# Patient Record
Sex: Female | Born: 1935 | Race: White | Hispanic: No | Smoking: Former smoker
Health system: Southern US, Community
[De-identification: ages and names within clinical notes are randomized; demographics above are authoritative.]

## PROBLEM LIST (undated history)

## (undated) DIAGNOSIS — E119 Type 2 diabetes mellitus without complications: Secondary | ICD-10-CM

## (undated) DIAGNOSIS — D649 Anemia, unspecified: Secondary | ICD-10-CM

## (undated) DIAGNOSIS — R011 Cardiac murmur, unspecified: Secondary | ICD-10-CM

## (undated) DIAGNOSIS — H548 Legal blindness, as defined in USA: Secondary | ICD-10-CM

## (undated) DIAGNOSIS — K08109 Complete loss of teeth, unspecified cause, unspecified class: Secondary | ICD-10-CM

## (undated) DIAGNOSIS — I8393 Asymptomatic varicose veins of bilateral lower extremities: Secondary | ICD-10-CM

## (undated) DIAGNOSIS — K219 Gastro-esophageal reflux disease without esophagitis: Secondary | ICD-10-CM

## (undated) DIAGNOSIS — M1990 Unspecified osteoarthritis, unspecified site: Secondary | ICD-10-CM

## (undated) DIAGNOSIS — E039 Hypothyroidism, unspecified: Secondary | ICD-10-CM

## (undated) DIAGNOSIS — I10 Essential (primary) hypertension: Secondary | ICD-10-CM

## (undated) DIAGNOSIS — I729 Aneurysm of unspecified site: Secondary | ICD-10-CM

## (undated) DIAGNOSIS — Z8719 Personal history of other diseases of the digestive system: Secondary | ICD-10-CM

## (undated) DIAGNOSIS — K222 Esophageal obstruction: Secondary | ICD-10-CM

## (undated) DIAGNOSIS — M109 Gout, unspecified: Secondary | ICD-10-CM

## (undated) HISTORY — PX: OTHER SURGICAL HISTORY: SHX169

## (undated) HISTORY — PX: CHOLECYSTECTOMY: SHX55

## (undated) HISTORY — PX: RECTAL SURGERY: SHX760

## (undated) HISTORY — PX: CERVICAL DISC SURGERY: SHX588

## (undated) HISTORY — PX: APPENDECTOMY: SHX54

## (undated) HISTORY — PX: ABDOMINAL HYSTERECTOMY: SHX81

## (undated) HISTORY — PX: COLONOSCOPY: SHX174

---

## 1965-08-30 DIAGNOSIS — I729 Aneurysm of unspecified site: Secondary | ICD-10-CM

## 1965-08-30 HISTORY — PX: CRANIOTOMY: SHX93

## 1965-08-30 HISTORY — DX: Aneurysm of unspecified site: I72.9

## 2004-04-22 ENCOUNTER — Encounter: Admission: RE | Admit: 2004-04-22 | Discharge: 2004-04-22 | Payer: Self-pay | Admitting: Neurosurgery

## 2004-05-26 ENCOUNTER — Inpatient Hospital Stay (HOSPITAL_COMMUNITY): Admission: RE | Admit: 2004-05-26 | Discharge: 2004-05-27 | Payer: Self-pay | Admitting: Neurosurgery

## 2004-06-17 ENCOUNTER — Encounter: Admission: RE | Admit: 2004-06-17 | Discharge: 2004-06-17 | Payer: Self-pay | Admitting: Neurosurgery

## 2004-09-24 ENCOUNTER — Ambulatory Visit: Payer: Self-pay | Admitting: Obstetrics and Gynecology

## 2006-04-25 ENCOUNTER — Ambulatory Visit: Payer: Self-pay

## 2006-04-29 ENCOUNTER — Ambulatory Visit: Payer: Self-pay

## 2007-07-21 ENCOUNTER — Ambulatory Visit: Payer: Self-pay | Admitting: Gastroenterology

## 2008-08-28 ENCOUNTER — Ambulatory Visit: Payer: Self-pay | Admitting: Family Medicine

## 2009-09-25 ENCOUNTER — Ambulatory Visit: Payer: Self-pay | Admitting: Nurse Practitioner

## 2011-06-29 DIAGNOSIS — R911 Solitary pulmonary nodule: Secondary | ICD-10-CM

## 2011-06-29 DIAGNOSIS — E1169 Type 2 diabetes mellitus with other specified complication: Secondary | ICD-10-CM | POA: Insufficient documentation

## 2011-06-29 DIAGNOSIS — F419 Anxiety disorder, unspecified: Secondary | ICD-10-CM | POA: Insufficient documentation

## 2011-06-29 DIAGNOSIS — Z8673 Personal history of transient ischemic attack (TIA), and cerebral infarction without residual deficits: Secondary | ICD-10-CM

## 2011-06-29 DIAGNOSIS — M810 Age-related osteoporosis without current pathological fracture: Secondary | ICD-10-CM

## 2011-06-29 DIAGNOSIS — K219 Gastro-esophageal reflux disease without esophagitis: Secondary | ICD-10-CM | POA: Insufficient documentation

## 2011-06-29 DIAGNOSIS — J302 Other seasonal allergic rhinitis: Secondary | ICD-10-CM | POA: Insufficient documentation

## 2011-06-29 DIAGNOSIS — M109 Gout, unspecified: Secondary | ICD-10-CM | POA: Insufficient documentation

## 2011-06-29 DIAGNOSIS — I340 Nonrheumatic mitral (valve) insufficiency: Secondary | ICD-10-CM

## 2011-06-29 HISTORY — DX: Type 2 diabetes mellitus with other specified complication: E11.69

## 2011-06-29 HISTORY — DX: Personal history of transient ischemic attack (TIA), and cerebral infarction without residual deficits: Z86.73

## 2011-06-29 HISTORY — DX: Solitary pulmonary nodule: R91.1

## 2011-06-29 HISTORY — DX: Nonrheumatic mitral (valve) insufficiency: I34.0

## 2011-06-29 HISTORY — DX: Age-related osteoporosis without current pathological fracture: M81.0

## 2011-08-04 ENCOUNTER — Ambulatory Visit: Payer: Self-pay | Admitting: Family Medicine

## 2011-09-02 ENCOUNTER — Ambulatory Visit: Payer: Self-pay | Admitting: Surgery

## 2011-09-02 DIAGNOSIS — E119 Type 2 diabetes mellitus without complications: Secondary | ICD-10-CM

## 2011-09-02 LAB — CBC WITH DIFFERENTIAL/PLATELET
Basophil #: 0 10*3/uL (ref 0.0–0.1)
Basophil %: 0.6 %
Eosinophil #: 0.2 10*3/uL (ref 0.0–0.7)
Eosinophil %: 2.6 %
HCT: 41.3 % (ref 35.0–47.0)
HGB: 14.1 g/dL (ref 12.0–16.0)
Lymphocyte #: 2 10*3/uL (ref 1.0–3.6)
Lymphocyte %: 25.6 %
MCH: 29.8 pg (ref 26.0–34.0)
MCHC: 34.2 g/dL (ref 32.0–36.0)
MCV: 87 fL (ref 80–100)
Monocyte #: 0.5 10*3/uL (ref 0.0–0.7)
Monocyte %: 6.9 %
Neutrophil #: 5.1 10*3/uL (ref 1.4–6.5)
Neutrophil %: 64.3 %
Platelet: 280 10*3/uL (ref 150–440)
RBC: 4.74 10*6/uL (ref 3.80–5.20)
RDW: 13.2 % (ref 11.5–14.5)
WBC: 7.9 10*3/uL (ref 3.6–11.0)

## 2011-09-02 LAB — BASIC METABOLIC PANEL
Anion Gap: 10 (ref 7–16)
BUN: 18 mg/dL (ref 7–18)
Calcium, Total: 9 mg/dL (ref 8.5–10.1)
Chloride: 102 mmol/L (ref 98–107)
Co2: 30 mmol/L (ref 21–32)
Creatinine: 0.95 mg/dL (ref 0.60–1.30)
EGFR (African American): >60
EGFR (Non-African Amer.): >60
Glucose: 250 mg/dL — ABNORMAL HIGH (ref 65–99)
Osmolality: 293 (ref 275–301)
Potassium: 3.2 mmol/L — ABNORMAL LOW (ref 3.5–5.1)
Sodium: 142 mmol/L (ref 136–145)

## 2011-09-07 ENCOUNTER — Ambulatory Visit: Payer: Self-pay | Admitting: Surgery

## 2011-09-07 LAB — BASIC METABOLIC PANEL
Anion Gap: 7 (ref 7–16)
BUN: 17 mg/dL (ref 7–18)
Calcium, Total: 8.9 mg/dL (ref 8.5–10.1)
Chloride: 100 mmol/L (ref 98–107)
Co2: 33 mmol/L — ABNORMAL HIGH (ref 21–32)
Creatinine: 0.93 mg/dL (ref 0.60–1.30)
EGFR (African American): >60
EGFR (Non-African Amer.): >60
Glucose: 151 mg/dL — ABNORMAL HIGH (ref 65–99)
Osmolality: 284 (ref 275–301)
Potassium: 3.5 mmol/L (ref 3.5–5.1)
Sodium: 140 mmol/L (ref 136–145)

## 2011-09-08 ENCOUNTER — Ambulatory Visit: Payer: Self-pay | Admitting: Surgery

## 2011-09-08 LAB — POTASSIUM: Potassium: 3.6 mmol/L (ref 3.5–5.1)

## 2011-09-09 LAB — COMPREHENSIVE METABOLIC PANEL
Albumin: 3.2 g/dL — ABNORMAL LOW (ref 3.4–5.0)
Alkaline Phosphatase: 71 U/L (ref 50–136)
Anion Gap: 10 (ref 7–16)
BUN: 7 mg/dL (ref 7–18)
Bilirubin,Total: 0.6 mg/dL (ref 0.2–1.0)
Calcium, Total: 8.4 mg/dL — ABNORMAL LOW (ref 8.5–10.1)
Chloride: 102 mmol/L (ref 98–107)
Co2: 27 mmol/L (ref 21–32)
Creatinine: 0.87 mg/dL (ref 0.60–1.30)
EGFR (African American): >60
EGFR (Non-African Amer.): >60
Glucose: 230 mg/dL — ABNORMAL HIGH (ref 65–99)
Osmolality: 283 (ref 275–301)
Potassium: 3.7 mmol/L (ref 3.5–5.1)
SGOT(AST): 23 U/L (ref 15–37)
SGPT (ALT): 24 U/L
Sodium: 139 mmol/L (ref 136–145)
Total Protein: 7.3 g/dL (ref 6.4–8.2)

## 2011-09-09 LAB — CBC WITH DIFFERENTIAL/PLATELET
Basophil #: 0 10*3/uL (ref 0.0–0.1)
Basophil %: 0.3 %
Eosinophil #: 0 10*3/uL (ref 0.0–0.7)
Eosinophil %: 0.2 %
HCT: 39.9 % (ref 35.0–47.0)
HGB: 13.4 g/dL (ref 12.0–16.0)
Lymphocyte #: 1.3 10*3/uL (ref 1.0–3.6)
Lymphocyte %: 11.9 %
MCH: 29.5 pg (ref 26.0–34.0)
MCHC: 33.7 g/dL (ref 32.0–36.0)
MCV: 87 fL (ref 80–100)
Monocyte #: 0.7 10*3/uL (ref 0.0–0.7)
Monocyte %: 6.5 %
Neutrophil #: 9 10*3/uL — ABNORMAL HIGH (ref 1.4–6.5)
Neutrophil %: 81.1 %
Platelet: 288 10*3/uL (ref 150–440)
RBC: 4.57 10*6/uL (ref 3.80–5.20)
RDW: 13.1 % (ref 11.5–14.5)
WBC: 11.1 10*3/uL — ABNORMAL HIGH (ref 3.6–11.0)

## 2011-09-09 LAB — PATHOLOGY REPORT

## 2011-12-24 ENCOUNTER — Emergency Department: Payer: Self-pay | Admitting: Emergency Medicine

## 2012-02-15 ENCOUNTER — Ambulatory Visit: Payer: Self-pay | Admitting: Family Medicine

## 2012-06-09 DIAGNOSIS — I10 Essential (primary) hypertension: Secondary | ICD-10-CM | POA: Insufficient documentation

## 2013-11-12 ENCOUNTER — Ambulatory Visit: Payer: Self-pay | Admitting: Unknown Physician Specialty

## 2013-11-25 DIAGNOSIS — D696 Thrombocytopenia, unspecified: Secondary | ICD-10-CM

## 2013-11-25 HISTORY — DX: Thrombocytopenia, unspecified: D69.6

## 2013-11-26 ENCOUNTER — Ambulatory Visit: Payer: Self-pay | Admitting: Family Medicine

## 2013-11-26 LAB — CBC
HCT: 35.8 % (ref 35.0–47.0)
HGB: 11.6 g/dL — ABNORMAL LOW (ref 12.0–16.0)
MCH: 27.5 pg (ref 26.0–34.0)
MCHC: 32.5 g/dL (ref 32.0–36.0)
MCV: 85 fL (ref 80–100)
Platelet: 588 10*3/uL — ABNORMAL HIGH (ref 150–440)
RBC: 4.23 10*6/uL (ref 3.80–5.20)
RDW: 14.6 % — ABNORMAL HIGH (ref 11.5–14.5)
WBC: 16 10*3/uL — ABNORMAL HIGH (ref 3.6–11.0)

## 2013-11-26 LAB — URINALYSIS, COMPLETE
Bacteria: NONE SEEN
Bilirubin,UR: NEGATIVE
Glucose,UR: NEGATIVE mg/dL (ref 0–75)
Ketone: NEGATIVE
Leukocyte Esterase: NEGATIVE
Nitrite: NEGATIVE
Ph: 6 (ref 4.5–8.0)
Protein: 30
RBC,UR: <1 /HPF (ref 0–5)
Specific Gravity: 1.025 (ref 1.003–1.030)
Squamous Epithelial: <1
WBC UR: 2 /HPF (ref 0–5)

## 2013-11-26 LAB — COMPREHENSIVE METABOLIC PANEL
Albumin: 3 g/dL — ABNORMAL LOW (ref 3.4–5.0)
Alkaline Phosphatase: 99 U/L
Anion Gap: 7 (ref 7–16)
BUN: 23 mg/dL — ABNORMAL HIGH (ref 7–18)
Bilirubin,Total: 0.4 mg/dL (ref 0.2–1.0)
Calcium, Total: 9.2 mg/dL (ref 8.5–10.1)
Chloride: 97 mmol/L — ABNORMAL LOW (ref 98–107)
Co2: 28 mmol/L (ref 21–32)
Creatinine: 1.07 mg/dL (ref 0.60–1.30)
EGFR (African American): 56 — ABNORMAL LOW
EGFR (Non-African Amer.): 49 — ABNORMAL LOW
Glucose: 214 mg/dL — ABNORMAL HIGH (ref 65–99)
Osmolality: 275 (ref 275–301)
Potassium: 3.7 mmol/L (ref 3.5–5.1)
SGOT(AST): 13 U/L — ABNORMAL LOW (ref 15–37)
SGPT (ALT): 18 U/L (ref 12–78)
Sodium: 132 mmol/L — ABNORMAL LOW (ref 136–145)
Total Protein: 7.9 g/dL (ref 6.4–8.2)

## 2013-11-26 LAB — MAGNESIUM: Magnesium: 1.6 mg/dL — ABNORMAL LOW

## 2013-11-26 LAB — LIPASE, BLOOD: Lipase: 119 U/L (ref 73–393)

## 2013-11-26 LAB — TROPONIN I: Troponin-I: <0.02 ng/mL

## 2013-11-27 ENCOUNTER — Observation Stay: Payer: Self-pay | Admitting: Internal Medicine

## 2013-11-27 LAB — CBC WITH DIFFERENTIAL/PLATELET
Basophil #: 0.1 10*3/uL (ref 0.0–0.1)
Basophil %: 0.6 %
Eosinophil #: 0.2 10*3/uL (ref 0.0–0.7)
Eosinophil %: 1.3 %
HCT: 36.1 % (ref 35.0–47.0)
HGB: 11.7 g/dL — ABNORMAL LOW (ref 12.0–16.0)
Lymphocyte #: 1.4 10*3/uL (ref 1.0–3.6)
Lymphocyte %: 10.4 %
MCH: 27.4 pg (ref 26.0–34.0)
MCHC: 32.6 g/dL (ref 32.0–36.0)
MCV: 84 fL (ref 80–100)
Monocyte #: 1 x10 3/mm — ABNORMAL HIGH (ref 0.2–0.9)
Monocyte %: 7.4 %
Neutrophil #: 10.7 10*3/uL — ABNORMAL HIGH (ref 1.4–6.5)
Neutrophil %: 80.3 %
Platelet: 574 10*3/uL — ABNORMAL HIGH (ref 150–440)
RBC: 4.28 10*6/uL (ref 3.80–5.20)
RDW: 14.5 % (ref 11.5–14.5)
WBC: 13.4 10*3/uL — ABNORMAL HIGH (ref 3.6–11.0)

## 2013-11-27 LAB — TROPONIN I
Troponin-I: <0.02 ng/mL
Troponin-I: <0.02 ng/mL

## 2013-11-27 LAB — BASIC METABOLIC PANEL
Anion Gap: 4 — ABNORMAL LOW (ref 7–16)
BUN: 13 mg/dL (ref 7–18)
Calcium, Total: 8.8 mg/dL (ref 8.5–10.1)
Chloride: 102 mmol/L (ref 98–107)
Co2: 29 mmol/L (ref 21–32)
Creatinine: 0.81 mg/dL (ref 0.60–1.30)
EGFR (African American): >60
EGFR (Non-African Amer.): >60
Glucose: 168 mg/dL — ABNORMAL HIGH (ref 65–99)
Osmolality: 274 (ref 275–301)
Potassium: 3.4 mmol/L — ABNORMAL LOW (ref 3.5–5.1)
Sodium: 135 mmol/L — ABNORMAL LOW (ref 136–145)

## 2013-11-27 LAB — CK TOTAL AND CKMB (NOT AT ARMC)
CK, Total: 29 U/L
CK, Total: 32 U/L
CK-MB: 1.3 ng/mL (ref 0.5–3.6)
CK-MB: 1 ng/mL (ref 0.5–3.6)

## 2013-12-02 LAB — CULTURE, BLOOD (SINGLE)

## 2013-12-17 DIAGNOSIS — R634 Abnormal weight loss: Secondary | ICD-10-CM | POA: Insufficient documentation

## 2013-12-17 DIAGNOSIS — R531 Weakness: Secondary | ICD-10-CM | POA: Insufficient documentation

## 2013-12-17 DIAGNOSIS — R2689 Other abnormalities of gait and mobility: Secondary | ICD-10-CM | POA: Insufficient documentation

## 2013-12-21 ENCOUNTER — Ambulatory Visit: Payer: Self-pay | Admitting: Family Medicine

## 2013-12-27 ENCOUNTER — Ambulatory Visit: Payer: Self-pay | Admitting: Internal Medicine

## 2013-12-27 LAB — CBC CANCER CENTER
HCT: 33.2 % — ABNORMAL LOW (ref 35.0–47.0)
HGB: 10.5 g/dL — ABNORMAL LOW (ref 12.0–16.0)
Lymphocytes: 13 %
MCH: 26.1 pg (ref 26.0–34.0)
MCHC: 31.6 g/dL — ABNORMAL LOW (ref 32.0–36.0)
MCV: 83 fL (ref 80–100)
Monocytes: 5 %
Platelet: 649 x10 3/mm — ABNORMAL HIGH (ref 150–440)
RBC: 4.03 10*6/uL (ref 3.80–5.20)
RDW: 15.7 % — ABNORMAL HIGH (ref 11.5–14.5)
Segmented Neutrophils: 82 %
WBC: 11.2 x10 3/mm — ABNORMAL HIGH (ref 3.6–11.0)

## 2013-12-27 LAB — RETICULOCYTES
Absolute Retic Count: 0.081 10*6/uL (ref 0.019–0.186)
Reticulocyte: 2.1 % (ref 0.4–3.1)

## 2013-12-27 LAB — IRON AND TIBC
Iron Bind.Cap.(Total): 303 ug/dL (ref 250–450)
Iron Saturation: 7 %
Iron: 21 ug/dL — ABNORMAL LOW (ref 50–170)
Unbound Iron-Bind.Cap.: 282 ug/dL

## 2013-12-27 LAB — FOLATE: Folic Acid: 15.9 ng/mL (ref 3.1–100.0)

## 2013-12-27 LAB — LACTATE DEHYDROGENASE: LDH: 129 U/L (ref 81–246)

## 2013-12-27 LAB — FERRITIN: Ferritin (ARMC): 140 ng/mL (ref 8–388)

## 2013-12-27 LAB — SEDIMENTATION RATE: Erythrocyte Sed Rate: 79 mm/hr — ABNORMAL HIGH (ref 0–30)

## 2013-12-28 ENCOUNTER — Ambulatory Visit: Payer: Self-pay | Admitting: Internal Medicine

## 2013-12-28 LAB — PROT IMMUNOELECTROPHORES(ARMC)

## 2014-01-04 ENCOUNTER — Ambulatory Visit: Payer: Self-pay | Admitting: Gastroenterology

## 2014-01-05 ENCOUNTER — Ambulatory Visit: Payer: Self-pay | Admitting: Internal Medicine

## 2014-01-23 ENCOUNTER — Ambulatory Visit: Payer: Self-pay | Admitting: Family Medicine

## 2014-01-28 ENCOUNTER — Ambulatory Visit: Payer: Self-pay | Admitting: Internal Medicine

## 2014-02-14 DIAGNOSIS — R809 Proteinuria, unspecified: Secondary | ICD-10-CM

## 2014-02-14 DIAGNOSIS — E559 Vitamin D deficiency, unspecified: Secondary | ICD-10-CM | POA: Insufficient documentation

## 2014-02-14 HISTORY — DX: Proteinuria, unspecified: R80.9

## 2014-04-03 ENCOUNTER — Ambulatory Visit: Payer: Self-pay | Admitting: Internal Medicine

## 2014-04-03 LAB — CBC CANCER CENTER
Basophil #: 0.1 x10 3/mm (ref 0.0–0.1)
Basophil %: 0.8 %
Eosinophil #: 0.2 x10 3/mm (ref 0.0–0.7)
Eosinophil %: 1.7 %
HCT: 37.7 % (ref 35.0–47.0)
HGB: 11.7 g/dL — ABNORMAL LOW (ref 12.0–16.0)
Lymphocyte #: 2.9 x10 3/mm (ref 1.0–3.6)
Lymphocyte %: 23.7 %
MCH: 25.4 pg — ABNORMAL LOW (ref 26.0–34.0)
MCHC: 31 g/dL — ABNORMAL LOW (ref 32.0–36.0)
MCV: 82 fL (ref 80–100)
Monocyte #: 0.5 x10 3/mm (ref 0.2–0.9)
Monocyte %: 4.5 %
Neutrophil #: 8.3 x10 3/mm — ABNORMAL HIGH (ref 1.4–6.5)
Neutrophil %: 69.3 %
Platelet: 391 x10 3/mm (ref 150–440)
RBC: 4.6 10*6/uL (ref 3.80–5.20)
RDW: 17.8 % — ABNORMAL HIGH (ref 11.5–14.5)
WBC: 12 x10 3/mm — ABNORMAL HIGH (ref 3.6–11.0)

## 2014-04-03 LAB — SEDIMENTATION RATE: Erythrocyte Sed Rate: 42 mm/hr — ABNORMAL HIGH (ref 0–30)

## 2014-04-03 LAB — IRON AND TIBC
Iron Bind.Cap.(Total): 391 ug/dL (ref 250–450)
Iron Saturation: 10 %
Iron: 39 ug/dL — ABNORMAL LOW (ref 50–170)
Unbound Iron-Bind.Cap.: 352 ug/dL

## 2014-04-03 LAB — FERRITIN: Ferritin (ARMC): 16 ng/mL (ref 8–388)

## 2014-04-30 ENCOUNTER — Ambulatory Visit: Payer: Self-pay | Admitting: Internal Medicine

## 2014-07-11 ENCOUNTER — Ambulatory Visit: Payer: Self-pay | Admitting: Internal Medicine

## 2014-07-11 LAB — FERRITIN: Ferritin (ARMC): 18 ng/mL (ref 8–388)

## 2014-07-11 LAB — CBC CANCER CENTER
Basophil #: 0.1 x10 3/mm (ref 0.0–0.1)
Basophil %: 1.2 %
Eosinophil #: 0.3 x10 3/mm (ref 0.0–0.7)
Eosinophil %: 2.5 %
HCT: 38.3 % (ref 35.0–47.0)
HGB: 12 g/dL (ref 12.0–16.0)
Lymphocyte #: 2.7 x10 3/mm (ref 1.0–3.6)
Lymphocyte %: 24.6 %
MCH: 27.2 pg (ref 26.0–34.0)
MCHC: 31.4 g/dL — ABNORMAL LOW (ref 32.0–36.0)
MCV: 87 fL (ref 80–100)
Monocyte #: 0.6 x10 3/mm (ref 0.2–0.9)
Monocyte %: 5 %
Neutrophil #: 7.4 x10 3/mm — ABNORMAL HIGH (ref 1.4–6.5)
Neutrophil %: 66.7 %
Platelet: 371 x10 3/mm (ref 150–440)
RBC: 4.41 10*6/uL (ref 3.80–5.20)
RDW: 15.1 % — ABNORMAL HIGH (ref 11.5–14.5)
WBC: 11.1 x10 3/mm — ABNORMAL HIGH (ref 3.6–11.0)

## 2014-07-11 LAB — IRON AND TIBC
Iron Bind.Cap.(Total): 411 ug/dL (ref 250–450)
Iron Saturation: 21 %
Iron: 85 ug/dL (ref 50–170)
Unbound Iron-Bind.Cap.: 326 ug/dL

## 2014-07-30 ENCOUNTER — Ambulatory Visit: Payer: Self-pay | Admitting: Internal Medicine

## 2014-10-09 ENCOUNTER — Ambulatory Visit: Payer: Self-pay | Admitting: Internal Medicine

## 2014-10-27 ENCOUNTER — Ambulatory Visit: Payer: Self-pay | Admitting: Family Medicine

## 2014-10-29 ENCOUNTER — Ambulatory Visit
Admit: 2014-10-29 | Disposition: A | Discharge status: Home / Self Care | Payer: Self-pay | Attending: Internal Medicine | Admitting: Internal Medicine

## 2014-12-10 IMAGING — CT CT CHEST-ABD-PELV W/ CM
2 of 5 series · 15 of 36 positions shown, 17 images · IV contrast (isovue)
Comparison: None.

CLINICAL DATA: Weight loss with loss of appetite, tachycardia and
esophageal mass. History of appendectomy, partial hysterectomy and
cholecystectomy. No history of malignancy.

EXAM:
CT CHEST, ABDOMEN, AND PELVIS WITH CONTRAST
TECHNIQUE: Multidetector CT imaging of the chest, abdomen and pelvis was
performed following the standard protocol during bolus
administration of intravenous contrast.
CONTRAST:  85 ml Isovue 370.

[Series 2: soft tissue · axial · 0.62mm/px · z∈[-800,-300]mm · 12 of 116 slices shown, 14 images]
[im 8/116  mediastinal]
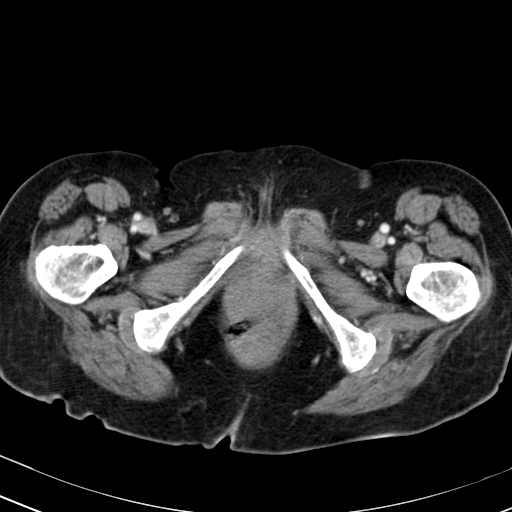
[im 8/116  bone]
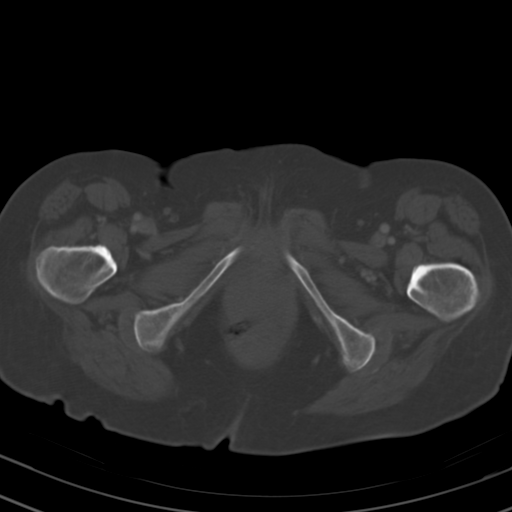
[im 15/116  mediastinal]
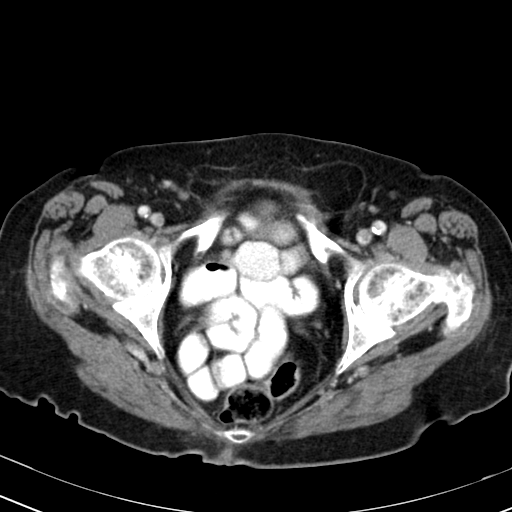
[im 29/116  mediastinal]
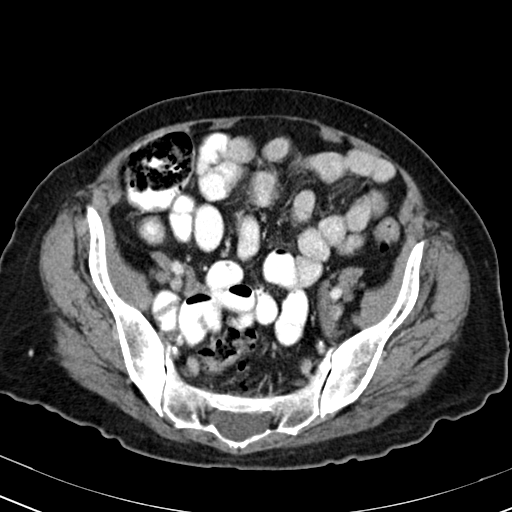
[im 36/116  mediastinal]
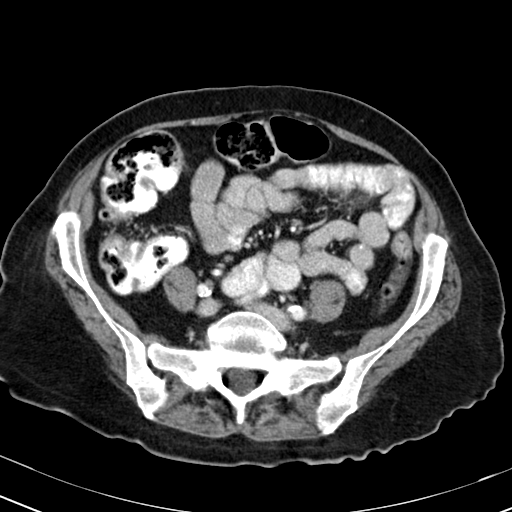
[im 44/116  mediastinal]
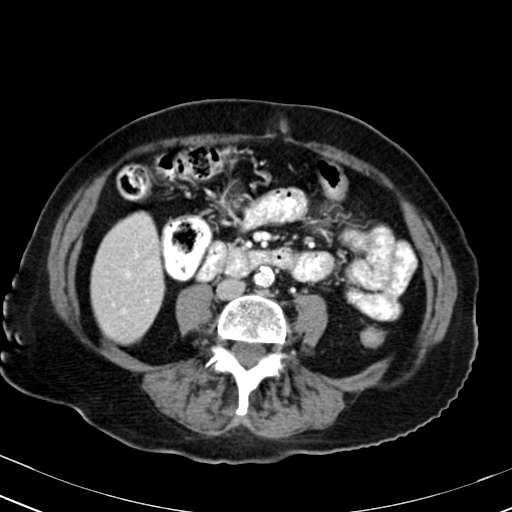
[im 51/116  mediastinal]
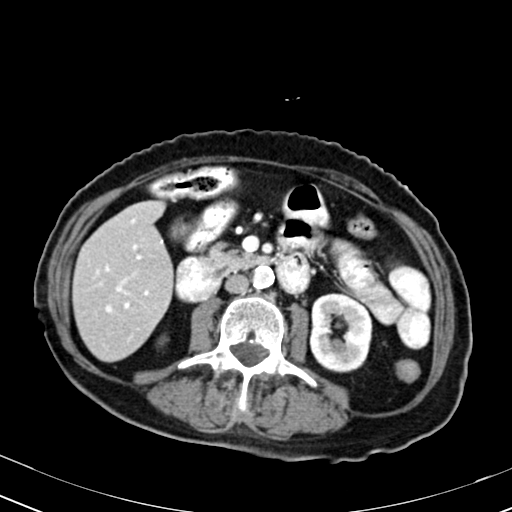
[im 65/116  mediastinal]
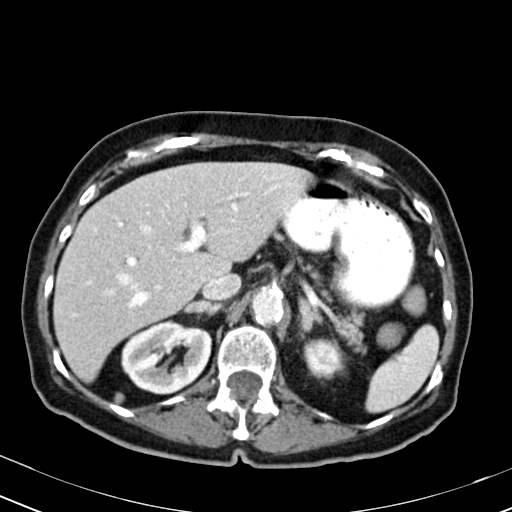
[im 72/116  mediastinal]
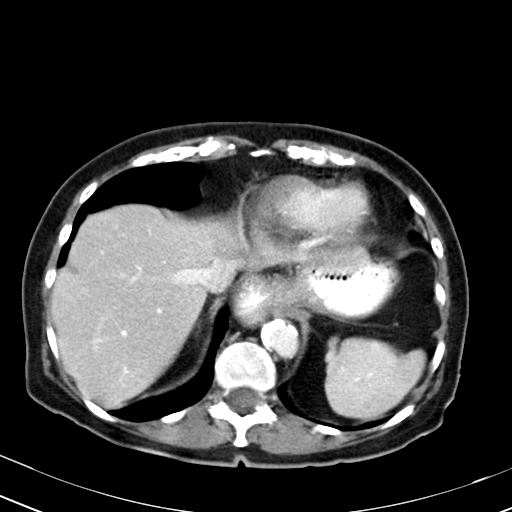
[im 80/116  mediastinal]
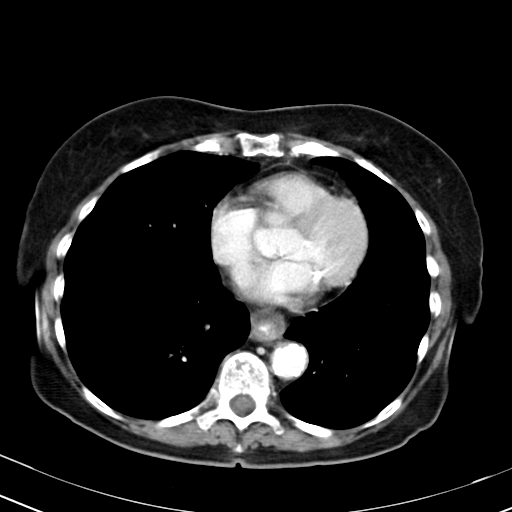
[im 80/116  bone]
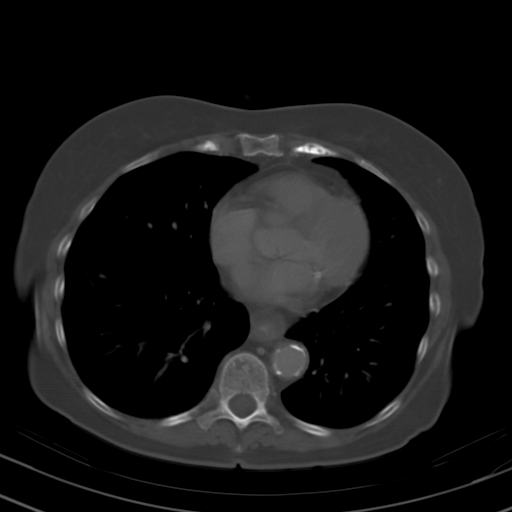
[im 87/116  mediastinal]
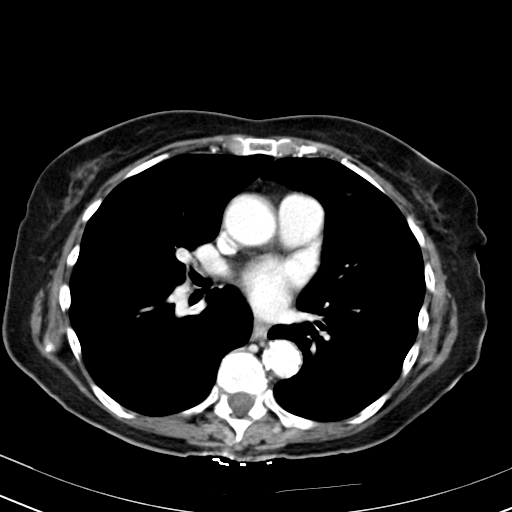
[im 101/116  mediastinal]
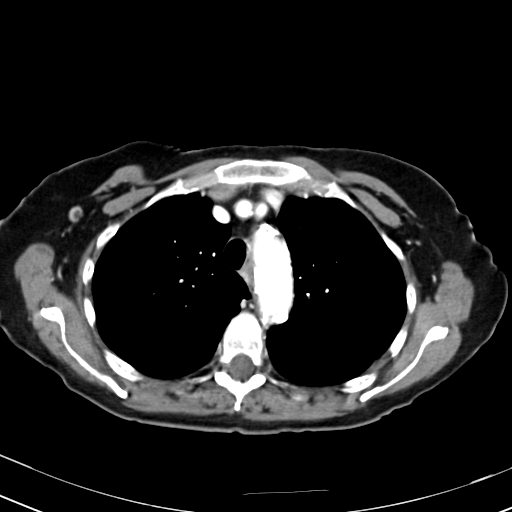
[im 108/116  mediastinal]
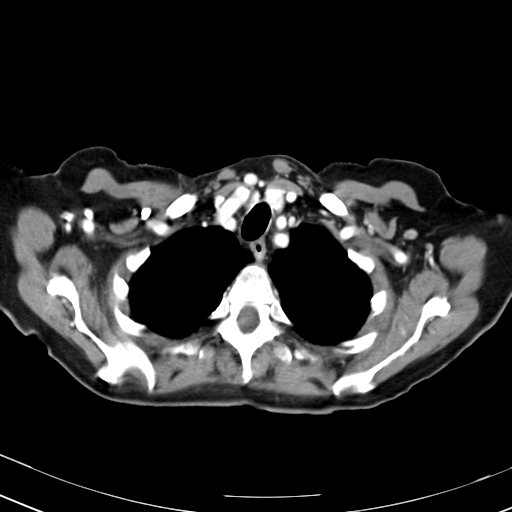

[Series 602: coronals · coronal · 1.12mm/px · 3 of 124 slices shown]
[im 25/124  mediastinal]
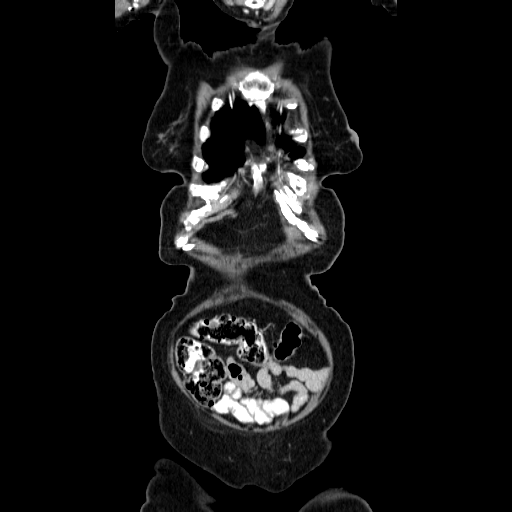
[im 50/124  mediastinal]
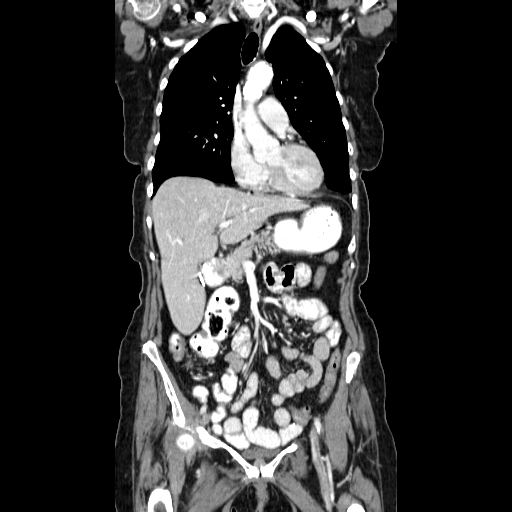
[im 74/124  mediastinal]
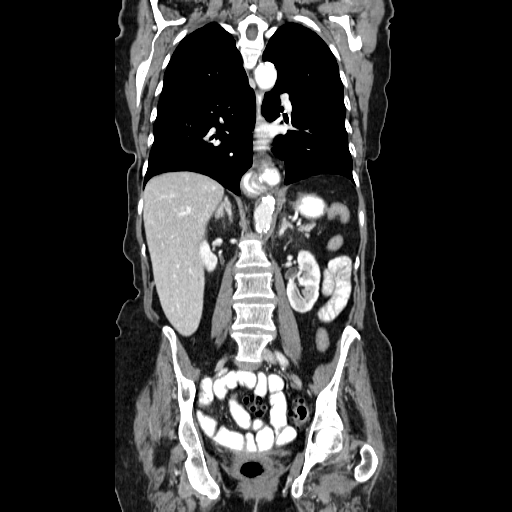

[15 of 36 positions shown; findings below may reference images not displayed]

FINDINGS: CT CHEST FINDINGS

There are no enlarged mediastinal, hilar or axillary lymph nodes.
The thyroid gland and thoracic inlet appear unremarkable. There is
extensive atherosclerosis of the aorta, coronary arteries and great
vessels.

There is no significant pleural or pericardial effusion. A
moderate-sized hiatal hernia is noted. There are postsurgical
changes at the gastroesophageal junction. There is no obvious
esophageal mass.

Mild emphysematous changes are present throughout the lungs. There
is a mildly lobulated noncalcified 1.2 x 1.2 cm left upper lobe
nodule on image 31. No other highly suspicious nodules are
demonstrated. However, there is an ill-defined ground-glass density
in the right lower low (axial image 30 but better seen on the
coronal image 98 and sagittal image 58). There are additional tiny
subpleural nodules in the right lung.

No worrisome osseous findings are demonstrated. Patient is status
post lower cervical fusion.

CT ABDOMEN AND PELVIS FINDINGS

The common hepatic duct is mildly dilated to 10 mm status post
cholecystectomy. The distal common bile duct tapers gradually to the
ampulla. There is no evidence of pancreatic mass or pancreatic
ductal dilatation. There is some focal fat in the liver adjacent to
the falciform ligament. The liver otherwise appears normal. Tiny
low-density splenic lesion on image 50 is unlikely to be clinically
significant. The spleen otherwise appears normal.

Both adrenal glands are mildly prominent without focal nodularity.
There are numerous low density renal lesions bilaterally which are
likely all cysts. No enhancing renal lesions are identified. There
is no hydronephrosis.

The intra-abdominal portion of the stomach, small bowel and colon
demonstrate no acute findings. There are diverticular changes of the
sigmoid colon without surrounding inflammation. There is diffuse
atherosclerosis of the aortoiliac and proximal visceral arteries. No
enlarged lymph nodes are demonstrated. Both ovaries are visualized
and appear normal. The bladder is nearly empty and suboptimally
evaluated. Asymmetric fat within the left inguinal canal is noted.

No worrisome osseous findings are demonstrated. There is sclerosis
at the symphysis pubis, asymmetric to the right and likely
degenerative.
IMPRESSION: 1. Mildly lobulated 1.2 cm left upper lobe nodule is concerning for
possible primary bronchogenic carcinoma. Solitary metastasis
considered less likely.
2. No evidence of thoracic lymphadenopathy.
3. No evidence of primary or metastatic disease within the abdomen
or pelvis.
4. Diffuse atherosclerosis.
5. Numerous probable renal cysts and mild adrenal hyperplasia.
6. For further evaluation of this left upper lobe nodule in this
83-year-old, PET-CT should be considered. If the patient is not a
surgical candidate, and the patient is at high risk for bronchogenic
carcinoma, follow-up chest CT at 3-6months is recommended. If the
patient is at low risk for bronchogenic carcinoma, follow-up chest
CT at 6-12 months is recommended. This recommendation follows the
consensus statement: Guidelines for Management of Small Pulmonary
Nodules Detected on CT Scans: A Statement from the [employer]
7. These results will be called to the ordering clinician or
representative by the Radiologist Assistant, and communication
documented in the PACS Dashboard.

## 2014-12-21 NOTE — Discharge Summary (Signed)
PATIENT NAME:  Nancy Green, Nancy Green MR#:  [id number] DATE OF BIRTH:  1929-11-17  DATE OF ADMISSION:  11/27/2013 DATE OF DISCHARGE:  11/27/2013  ADMITTING PHYSICIAN: Starleen Armsawood S. Elgergawy, MD  DISCHARGING PHYSICIAN: Enid Baasadhika Sera Hitsman, MD  PRIMARY CARE PHYSICIAN: Letitia CaulHeidi M. Grandis, MD  DISCHARGE DIAGNOSES: 1.  Mechanical fall with traumatic injury to left side of forehead.  2.  Hypertension.  3.  Hyperlipidemia.  4.  Elevated white count secondary to stress reaction.  5.  Gout.  6.  Gastroesophageal reflux disease.  7.  History of cerebrovascular accident with no residual neurological deficits.  8.  History of lung nodule. Continue to follow up with PCP.  9.  Diabetes mellitus.  DISCHARGE MEDICATIONS: 1.  Losartan 100 mg p.o. daily.  2.  Plavix 75 mg daily.  3.  Triamterene/HCTZ 37.5/25 mg 1 tablet p.o. daily.  4.  Allopurinol 100 mg p.o. daily.  5.  Metformin 500 mg p.o. at bedtime.  6.  Tramadol 150 mg q.4 hours p.r.n. for pain. 7.  Nifedipine 60 mg p.o. daily.   DISCHARGE DIET: Low-sodium diet.   DISCHARGE ACTIVITY: As tolerated.    FOLLOWUP INSTRUCTIONS: PCP followup in 2 weeks.   LABORATORY AND IMAGING STUDIES PRIOR TO DISCHARGE: WBC 13.4, hemoglobin 11.7, hematocrit 36.1, platelet count 574.   Sodium 135, potassium 3.4, chloride 102, bicarbonate 29, BUN 13, creatinine 0.81, glucose 168, and calcium of 8.8. Blood cultures are negative. Cardiac enzymes negative x 3.   Chest x-ray is showing emphysematous changes and left mid lung nodular opacity noted. CT of the head and CT of C-spine showing no acute intracranial abnormalities, scalp hematoma over the left superior orbital rim, old infarcts, and C-spine showing no acute abnormality, degenerative and postoperative changes seen.   BRIEF HOSPITAL COURSE: Nancy Green is an 81 year old elderly Caucasian female who lives at home by herself, ambulates with the help of a cane around the house, presented to the  hospital secondary to a  fall on the porch.  1.  Mechanical fall. No syncope suspected. The patient has some trauma to the left side of her forehead. CT of the head and C-spine were negative other than scalp hematoma, which is improving with some ice packs and pain medications as needed.  2.  Elevated white count and tachycardia, likely secondary to stress reaction, also from the pain and after fall the injury. Initially there was a suspicion for SIRS, but that was ruled out. Because the patient was having increased frequency of urination, she was given a dose of Levaquin; however, her urinalysis was completely clean, so she is not being discharged on any antibiotics.  3.  Hypertension. The patient has been stable. She will be discharged on her home medications.  4.  Diabetes mellitus. The patient is on metformin at bedtime, which she will continue.  5.  History of lung nodule on the left mid lung, which is chronic according to patient, and her PCP is following as an outpatient.  6.  Her course has been otherwise uneventful in the hospital. The patient has ambulated well prior to discharge, so she is being discharged home.   DISCHARGE CONDITION: Stable.   DISCHARGE DISPOSITION: Home.   TIME SPENT ON DISCHARGE: 40 minutes.   ____________________________ Enid Baasadhika Ketara Cavness, MD rk:jcm D: 11/27/2013 12:30:34 ET T: 11/27/2013 13:04:17 ET JOB#: [id number]  cc: Enid Baasadhika Rifka Ramey, MD, <Dictator> Letitia CaulHeidi M. Grandis, MD Enid BaasADHIKA Chirag Krueger MD ELECTRONICALLY SIGNED 12/06/2013 14:00

## 2014-12-21 NOTE — H&P (Signed)
PATIENT NAME:  Nancy Green, ZAHN MR#:  [id number] DATE OF BIRTH:  31-May-1930  DATE OF ADMISSION:  11/27/2013  REQUESTING PHYSICIAN: Loleta Rose, MD  PRIMARY CARE PHYSICIAN: [facility].   CHIEF COMPLAINT: Fall.   HISTORY OF PRESENT ILLNESS: This is an 81 year old female known history of hyperlipidemia and diabetes and hypertension and CVA in the past, presents with fall, the patient reports she fell at the porch. The patient reports she tripped while she was at the porch when she was doing some yard work. She denies any dizziness, any lightheadedness, any loss of consciousness. She reports she had head trauma when she fell. Reports she has been feeling fine at state of health. As well in the morning she had a followup with her PCP where she had CT abdomen and pelvis ordered as an outpatient where she had it done. She denies any chest pain, any shortness of breath, any nausea, any vomiting, or any other complaints. The patient's CT head and CT cervical spine did not show did not show any acute findings beside some scalp hematoma and left superior orbital rim with old infarct on the CT.   LABORATORY DATA: The patient's WBC at 16,000. Urinalysis was negative.  Chest x-ray did not show any infiltrate or opacity. There was an incidental finding of left lung nodule. The patient reports this is known to her. This is chronic. This has been followed by her PCP for a few years and it is stable.   The hospitalist service requested to admit the patient for hydration as she was found to be tachycardic.   PAST MEDICAL HISTORY:  1. Hyperlipidemia.  2. Diabetes and hypertension.  3. Reflux disease.  4. Osteoporosis.  5. Cerebrovascular accident.  6. Gout.  7. Anxiety.   PAST SURGICAL HISTORY:  2. Appendectomy.  3. Hysterectomy.  4. Epidural hematoma.  5. Aneurysm of her brain.   ALLERGIES: PENICILLIN, NEOMYCIN, PENICILLIN, AND ZANTAC.   HOME MEDICATIONS:  1. Tramadol 50 every 4  hours as needed.  2. Losartan 100 mg oral daily.  3. Metformin extended release 500 mg oral at bedtime 4. Allopurinol 100 mg oral daily.  5. Plavix 75 mg oral daily.  6. Hydrochlorothiazide/triamterene 25/37.5 mg once daily.  7. Nifedipine extended release 60 mg oral daily.   SOCIAL HISTORY: The patient quit smoking almost 30 years ago. No alcohol. No illicit drug use.   FAMILY HISTORY: Denies any coronary artery disease at young age in the family.   REVIEW OF SYSTEMS:  GENERAL: Denies fever, chills, fatigue, weakness, weight gain, weight loss.  EYES: Denies blurry vision, double vision, inflammation, glaucoma.  ENT: Denies any tinnitus, ear pain, hearing loss, epistaxis, or discharge.  RESPIRATORY: Denies cough, wheezing, hemoptysis, COPD.  CARDIOVASCULAR: Denies chest pain, edema, palpitation, syncope.  GASTROINTESTINAL: Reports some mild nausea. Denies any vomiting, diarrhea, abdominal pain, hematemesis.  GENITOURINARY: Denies dysuria, hematuria, renal colic.  ENDOCRINE: Denies polyuria, polydipsia, heat or cold intolerance. HEMATOLOGY: Denies anemia, easy bruising, wheezing, bleeding diathesis.  INTEGUMENTARY: Denies any rash, skin lesions.  MUSCULOSKELETAL: Reports history of gout and arthritis. Denies any cramps, any swelling. NEUROLOGIC: History of CVA in the past. Denies any tremors, vertigo, ataxia, lightheadedness, or dizziness.  PSYCHIATRIC: Denies anxiety, insomnia, or depression.   PHYSICAL EXAMINATION:  VITAL SIGNS: Temperature 98.5, pulse 108, respiratory rate 18, blood pressure 142/78, saturating 97% on room air. Upon presentation, blood pressure was 194/81 with heart rate of 120.  GENERAL: Well-nourished female who looks comfortable in bed,  in no apparent distress.  HEENT: Head has left supraorbital bruise and hematoma, normocephalic. Pink conjunctivae. Anicteric sclerae. Moist oral mucosa.  NECK: Supple. No thyromegaly. No JVD.  CHEST: Good air entry bilaterally. No  wheezing, rales, or rhonchi.  CARDIOVASCULAR: S1, S2 heard. No rubs, murmur, gallops.  ABDOMEN: Soft, nontender, nondistended. Bowel sounds present.  EXTREMITIES: No edema. No clubbing. No cyanosis.  PSYCHIATRIC: Appropriate affect. Awake, alert x 3. Intact judgment and insight.  MUSCULOSKELETAL: No joint effusion or erythema.  SKIN: Normal skin turgor. Warm and dry.   PERTINENT LABORATORIES: Glucose 214, BUN 23, creatinine 1.07, sodium 132, potassium 3.7, chloride 97, CO2 of 28, ALT 18, AST 13, alkaline phosphatase 99. Troponin less than 0.02. White blood cells 16, hemoglobin 11.6, hematocrit 35.8, platelets 588,000. Urinalysis negative for leukocyte esterase and nitrite.    ASSESSMENT AND PLAN:  1. Fall appears to be mechanical fall. There is no syncope. No presyncope. No loss of consciousness. Will hydrate patient, will keep on p.r.n. pain meds.  2. Systemic inflammatory response syndrome, patient presents with tachycardia and leukocytosis. Source of leukocytosis is unclear. Had negative urinalysis, negative findings for infection in her CT chest, abdominal, and pelvis. This is most likely reactive to her fall. The patient has been tachycardic as well. As well this is mostly reactive to her fall and pain. Will hydrate patient. We will check blood cultures and monitor. The patient has negative urinalysis.  3. Incidental finding of lung nodule on her CT chest. The patient reports she is known to have this lung nodule in her left lung for long period of time, which has been followed by her primary care physician. Reports she has been [facility] that and reports she has been more than 10 years and it is stable and is being followed by his PCP.  4. Hypertension. Initially uncontrolled. Will continue patient back on her home medication.  5. Hyperlipidemia. Continue with statin.  6. Diabetes resume metformin. Add insulin sliding scale.  7. Reflux disease. Continue with proton  pump inhibitor.  8. History of cerebrovascular accident. The patient is on Plavix and statin.  9. Gout. Continue with allopurinol.  10. Deep vein thrombosis prophylaxis. Subcutaneous heparin. Gastrointestinal prophylaxis on proton pump inhibitor.  CODE STATUS: The patient reports she is a full code.   TOTAL TIME SPENT ON ADMISSION AND PATIENT CARE: 55 minutes.    ____________________________ Starleen Armsawood S. Alvester Eads, MD dse:lt/am D: 11/27/2013 00:35:43 ET T: 11/27/2013 01:09:11 ET JOB#: [id number]  cc: Starleen Armsawood S. Daina Cara, MD, <Dictator> Kentavious Michele Teena IraniS Ceri Mayer MD ELECTRONICALLY SIGNED 12/01/2013 12:20

## 2014-12-22 NOTE — H&P (Signed)
PATIENT NAME:  Nancy Green, Nancy W MR#:  [id number] DATE OF BIRTH:  [birth year]  DATE OF ADMISSION:  09/08/2011  CHIEF COMPLAINT: Epigastric pain.   HISTORY OF PRESENT ILLNESS/HOSPITAL COURSE: This is a patient with almost daily attacks of right upper quadrant pain. She has had nausea associated with it, occasional emesis. These attacks last for approximately 30 minutes. Onset is brought on by fatty food intolerance. She has no jaundice or acholic stool history, has been going on for several months. She was seen in the office in December where elective laparoscopic cholecystectomy was planned. She is here for elective laparoscopic cholecystectomy.   PAST SURGICAL HISTORY:  1. Lap Nissen. 2. Appendectomy. 3. Hysterectomy. 4. Epidural hematoma. 5. Aneurysm of her brain.   PAST MEDICAL HISTORY:  1. Hyperlipidemia.  2. Diabetes. 3. Reflux disease. 4. Osteoporosis. 5. Stroke. 6. Gout. 7. Anxiety.  MEDICATIONS:  1. Allopurinol.  2. Clopidogril.  3. Colchicine.  4. Losartan.  5. Metformin. 6. Nifedipine. 7. Triamterene hydrochlorothiazide.   ALLERGIES: Penicillin.   SOCIAL HISTORY: Patient stopped smoking many years ago. Works as a Event organisercoffee hostess at [employer] and does not drink alcohol.   REVIEW OF SYSTEMS: 10 system review has been performed and negative with the exception of that mentioned in the history of present illness.   FAMILY HISTORY: Noncontributory.   PHYSICAL EXAMINATION:  GENERAL: Healthy-appearing female patient.   HEENT: No scleral icterus.   NECK: No palpable neck nodes.   CHEST: Clear to auscultation.   CARDIAC: Regular rate and rhythm.   ABDOMEN: Soft, nontender. Multiple scars are noted.   EXTREMITIES: Without edema.   NEUROLOGIC: Grossly intact.   INTEGUMENT: No jaundice.   LABORATORY, DIAGNOSTIC AND RADIOLOGICAL DATA: Laboratory values show no sign of choledocholithiasis and ultrasound shows stones.   ASSESSMENT AND PLAN: This is a patient with  symptomatic cholelithiasis. She is here for elective laparoscopic cholecystectomy. I discussed with her the fact that she has had prior surgery especially lap Nissen which may preclude the ability to perform the operation laparoscopically and conversion to an open procedure may be necessary. We discussed the risks of bleeding, infection, bowel injury, open procedure, bile duct damage, bile duct leak, retained common bile duct stone, any of which could require further surgery and/or ERCP, stent, and papillotomy and this was all discussed with her. She understood and agreed to proceed.  ____________________________ Adah Salvageichard E. Excell Seltzerooper, MD rec:cms D: 09/07/2011 21:09:10 ET T: 09/08/2011 06:40:45 ET JOB#: [id number]  cc: Adah Salvageichard E. Excell Seltzerooper, MD, <Dictator> Lattie HawICHARD E Maridee Slape MD ELECTRONICALLY SIGNED 09/08/2011 8:03

## 2014-12-22 NOTE — Op Note (Signed)
PATIENT NAME:  Nancy Green, Nancy Green MR#:  [id number] DATE OF BIRTH:  March 05, 1930  DATE OF PROCEDURE:  09/08/2011  PREOPERATIVE DIAGNOSIS: Symptomatic cholelithiasis.   POSTOPERATIVE DIAGNOSIS: Symptomatic cholelithiasis.   PROCEDURE: Laparoscopic cholecystectomy.   SURGEON: Dionne Milo, M.D.   ANESTHESIA: General with endotracheal tube.   INDICATIONS: This is a patient with recurrent epigastric and right upper quadrant pain associated with fatty food intolerance and work-up showing gallstones with normal liver function tests. Preoperatively we discussed rationale for surgery, the options of observation, risk of bleeding, infection, recurrence of symptoms, failure to resolve her symptoms, open procedure, bile duct damage, bile duct leak, and retained common bile duct stone any of which could require further surgery and/or ERCP, stent, and papillotomy. We also discussed the risk of bowel injury and adhesions due to her prior surgeries including laparoscopic Nissen fundoplication. She understood and agreed to proceed.   FINDINGS: Essentially no adhesions in the abdomen. No hindrance to placement of ports. There were adhesions to the anterior surface of the gallbladder however suggesting prior attacks. The cystic duct was quite long, but dissected out in total to the infundibulum of the gallbladder.   DESCRIPTION OF PROCEDURE: The patient was induced to general anesthesia. She was given IV antibiotics and VTE prophylaxis was in place. She was prepped and draped in a sterile fashion. Marcaine was infiltrated in the skin and subcutaneous tissues around the periumbilical area, an incision was made, Veress needle was placed, pneumoperitoneum was obtained, and a 5 mm trocar port was placed. The abdominal cavity was explored and under direct vision a 10 mm epigastric port and two lateral 5 mm ports were placed. There was no sign of adhesions that would be a hindrance or involve the port placement, and there  was no sign of bowel injury at this point.   The gallbladder was placed on tension. Adhesions were identified suggesting prior inflammatory process to the gallbladder. The adhesions were taken down bluntly. The peritoneum over the infundibulum was incised bluntly. The cystic lymphatics were doubly clipped and divided and the cystic duct was dissected. It was found to be quite long. It was dissected all the way up to the infundibulum and lateral and medial peritoneum was taken down off the gallbladder to further dissect out the infundibulum of the gallbladder to ensure that the cystic duct was in fact entering the infundibulum and that was the structure that had been identified. In doing so, the cystic artery was doubly clipped and divided. Again this made it easier to identify. The cystic duct entered the infundibulum. Here it was doubly clipped and divided and then the gallbladder was taken from the gallbladder fossa with electrocautery and passed out through the epigastric port site with the aid of an Endo Catch bag. No stones were palpable in the gallbladder, but the bile was quite thick and sludge-like.   The area was irrigated with copious amounts of normal saline. There was no sign of bile leak, bowel injury, or bleeding. Therefore, pneumoperitoneum was released after placing the camera in the epigastric site to view back to the periumbilical site to again ensure that no adhesions were present and no bowel injury had occurred. Once pneumoperitoneum was completely released, the fascial edges at the epigastric site were approximated with 0 Vicryl. 4-0 subcuticular Monocryl was used at all skin edges. Steri-Strips, Mastisol, and sterile dressings were placed.   The patient tolerated the procedure well. There were no complications. She was taken to the Recovery Room in stable condition  to be admitted for continued care.  ____________________________ Adah Salvageichard E. Excell Seltzerooper, MD rec:slb D: 09/08/2011 09:32:28  ET T: 09/08/2011 09:57:03 ET JOB#: [id number]  cc: Adah Salvageichard E. Excell Seltzerooper, MD, <Dictator> Lattie HawICHARD E Elainah Rhyne MD ELECTRONICALLY SIGNED 09/08/2011 11:24

## 2014-12-26 ENCOUNTER — Other Ambulatory Visit: Payer: Self-pay | Admitting: *Deleted

## 2014-12-26 DIAGNOSIS — D509 Iron deficiency anemia, unspecified: Secondary | ICD-10-CM | POA: Insufficient documentation

## 2015-01-02 ENCOUNTER — Ambulatory Visit: Payer: BLUE CROSS/BLUE SHIELD | Admitting: Internal Medicine

## 2015-01-02 ENCOUNTER — Other Ambulatory Visit: Payer: BLUE CROSS/BLUE SHIELD

## 2015-04-14 ENCOUNTER — Other Ambulatory Visit
Admission: RE | Admit: 2015-04-14 | Discharge: 2015-04-14 | Disposition: A | Discharge status: Home / Self Care | Payer: Medicare Other | Source: Ambulatory Visit | Attending: Ophthalmology | Admitting: Ophthalmology

## 2015-04-14 DIAGNOSIS — H47012 Ischemic optic neuropathy, left eye: Secondary | ICD-10-CM | POA: Diagnosis present

## 2015-04-14 LAB — CBC WITH DIFFERENTIAL/PLATELET
Basophils Absolute: 0.1 10*3/uL (ref 0–0.1)
Basophils Relative: 1 %
EOS ABS: 0.2 10*3/uL (ref 0–0.7)
EOS PCT: 2 %
HCT: 39.8 % (ref 35.0–47.0)
HEMOGLOBIN: 12.9 g/dL (ref 12.0–16.0)
LYMPHS ABS: 2.1 10*3/uL (ref 1.0–3.6)
Lymphocytes Relative: 20 %
MCH: 28.7 pg (ref 26.0–34.0)
MCHC: 32.5 g/dL (ref 32.0–36.0)
MCV: 88.4 fL (ref 80.0–100.0)
MONOS PCT: 6 %
Monocytes Absolute: 0.6 10*3/uL (ref 0.2–0.9)
Neutro Abs: 7.5 10*3/uL — ABNORMAL HIGH (ref 1.4–6.5)
Neutrophils Relative %: 71 %
PLATELETS: 370 10*3/uL (ref 150–440)
RBC: 4.5 MIL/uL (ref 3.80–5.20)
RDW: 15.2 % — ABNORMAL HIGH (ref 11.5–14.5)
WBC: 10.6 10*3/uL (ref 3.6–11.0)

## 2015-04-14 LAB — C-REACTIVE PROTEIN: CRP: 1.6 mg/dL — ABNORMAL HIGH (ref <1.0)

## 2015-04-14 LAB — SEDIMENTATION RATE: SED RATE: 41 mm/h — AB (ref 0–30)

## 2015-04-21 DIAGNOSIS — M316 Other giant cell arteritis: Secondary | ICD-10-CM | POA: Insufficient documentation

## 2015-04-21 HISTORY — DX: Other giant cell arteritis: M31.6

## 2015-05-28 ENCOUNTER — Encounter: Payer: Self-pay | Admitting: Emergency Medicine

## 2015-05-28 ENCOUNTER — Emergency Department
Admission: EM | Admit: 2015-05-28 | Discharge: 2015-05-28 | Disposition: A | Discharge status: Home / Self Care | Payer: Medicare Other | Attending: Emergency Medicine | Admitting: Emergency Medicine

## 2015-05-28 ENCOUNTER — Emergency Department: Payer: Medicare Other

## 2015-05-28 DIAGNOSIS — R05 Cough: Secondary | ICD-10-CM | POA: Diagnosis present

## 2015-05-28 DIAGNOSIS — Z792 Long term (current) use of antibiotics: Secondary | ICD-10-CM | POA: Insufficient documentation

## 2015-05-28 DIAGNOSIS — J40 Bronchitis, not specified as acute or chronic: Secondary | ICD-10-CM

## 2015-05-28 DIAGNOSIS — E119 Type 2 diabetes mellitus without complications: Secondary | ICD-10-CM | POA: Diagnosis not present

## 2015-05-28 DIAGNOSIS — Z880 Allergy status to penicillin: Secondary | ICD-10-CM | POA: Insufficient documentation

## 2015-05-28 DIAGNOSIS — J209 Acute bronchitis, unspecified: Secondary | ICD-10-CM | POA: Diagnosis not present

## 2015-05-28 DIAGNOSIS — Z79899 Other long term (current) drug therapy: Secondary | ICD-10-CM | POA: Insufficient documentation

## 2015-05-28 HISTORY — DX: Legal blindness, as defined in USA: H54.8

## 2015-05-28 HISTORY — DX: Type 2 diabetes mellitus without complications: E11.9

## 2015-05-28 LAB — CBC
HCT: 45.2 % (ref 35.0–47.0)
HEMOGLOBIN: 14.7 g/dL (ref 12.0–16.0)
MCH: 28.7 pg (ref 26.0–34.0)
MCHC: 32.5 g/dL (ref 32.0–36.0)
MCV: 88.4 fL (ref 80.0–100.0)
Platelets: 330 10*3/uL (ref 150–440)
RBC: 5.11 MIL/uL (ref 3.80–5.20)
RDW: 15.8 % — ABNORMAL HIGH (ref 11.5–14.5)
WBC: 14.4 10*3/uL — ABNORMAL HIGH (ref 3.6–11.0)

## 2015-05-28 LAB — BASIC METABOLIC PANEL
ANION GAP: 13 (ref 5–15)
BUN: 33 mg/dL — ABNORMAL HIGH (ref 6–20)
CALCIUM: 9.4 mg/dL (ref 8.9–10.3)
CO2: 24 mmol/L (ref 22–32)
Chloride: 107 mmol/L (ref 101–111)
Creatinine, Ser: 1.1 mg/dL — ABNORMAL HIGH (ref 0.44–1.00)
GFR, EST AFRICAN AMERICAN: 52 mL/min — AB (ref >60)
GFR, EST NON AFRICAN AMERICAN: 45 mL/min — AB (ref >60)
Glucose, Bld: 315 mg/dL — ABNORMAL HIGH (ref 65–99)
Potassium: 3.6 mmol/L (ref 3.5–5.1)
Sodium: 144 mmol/L (ref 135–145)

## 2015-05-28 LAB — GLUCOSE, CAPILLARY: GLUCOSE-CAPILLARY: 284 mg/dL — AB (ref 65–99)

## 2015-05-28 MED ORDER — DOXYCYCLINE HYCLATE 100 MG PO TABS: 100.0000 mg | ORAL_TABLET | Freq: Two times a day (BID) | ORAL | Status: DC

## 2015-05-28 MED ORDER — ALBUTEROL SULFATE HFA 108 (90 BASE) MCG/ACT IN AERS: 2.0000 | INHALATION_SPRAY | RESPIRATORY_TRACT | Status: DC | PRN

## 2015-05-28 NOTE — Discharge Instructions (Signed)

## 2015-05-28 NOTE — ED Provider Notes (Signed)
[facility] Emergency Department Provider Note  ____________________________________________  Time seen: Approximately 5:42 PM  I have reviewed the triage vital signs and the nursing notes.   HISTORY  Chief Complaint Cough    HPI Nancy Green is a 81 y.o. female who presents to the emergency department with a three-day history of productive cough. She states that she was seen by her primary care center to the emergency department for further evaluation for "pneumonia." The patient denies any fever/chills, nasal congestion, sore throat, shortness of breath. She does endorse a decrease of energy below normal but is still able to perform daily functions. She does not become short of breath for performing this daily functions. She has a history of temporal arteritis which she is currently being treated with prednisone. She is not taking any symptomatic medications for a cough prior to arrival.   Past Medical History  Diagnosis Date  . Legal blindness of left eye, as defined in [place]   . Diabetes mellitus without complication     Patient Active Problem List   Diagnosis Date Noted  . IDA (iron deficiency anemia) 12/26/2014    Past Surgical History  Procedure Laterality Date  . Abdominal hysterectomy    . Appendectomy    . Cholecystectomy      Current Outpatient Rx  Name  Route  Sig  Dispense  Refill  . albuterol (PROVENTIL HFA;VENTOLIN HFA) 108 (90 BASE) MCG/ACT inhaler   Inhalation   Inhale 2 puffs into the lungs every 4 (four) hours as needed for wheezing or shortness of breath.   1 Inhaler   0   . allopurinol (ZYLOPRIM) 100 MG tablet   Oral   Take 100 mg by mouth daily.         . clopidogrel (PLAVIX) 75 MG tablet   Oral   Take 75 mg by mouth daily.         Marland Kitchen doxycycline (VIBRA-TABS) 100 MG tablet   Oral   Take 1 tablet (100 mg total) by mouth 2 (two) times daily.   14 tablet   0   . ferrous sulfate 325 (65 FE) MG tablet   Oral   Take 325 mg by mouth 3 (three) times daily with meals.         Marland Kitchen losartan (COZAAR) 100 MG tablet   Oral   Take 100 mg by mouth daily.         . metFORMIN (GLUCOPHAGE) 500 MG tablet   Oral   Take by mouth at bedtime.         Marland Kitchen NIFEdipine (PROCARDIA XL/ADALAT-CC) 60 MG 24 hr tablet   Oral   Take 60 mg by mouth daily.         Marland Kitchen NIFEdipine (PROCARDIA-XL/ADALAT CC) 60 MG 24 hr tablet   Oral   Take 60 mg by mouth daily.         Marland Kitchen triamterene-hydrochlorothiazide (DYAZIDE) 37.5-25 MG per capsule   Oral   Take 1 capsule by mouth daily.           Allergies Zantac ; Neomycin; and Penicillin g  No family history on file.  Social History Social History  Substance Use Topics  . Smoking status: Never Smoker   . Smokeless tobacco: None  . Alcohol Use: No    Review of Systems Constitutional: No fever/chills Eyes: No visual changes. ENT: No sore throat. Cardiovascular: Denies chest pain. Respiratory: Denies shortness of breath. Endorses a productive cough. Gastrointestinal: No abdominal pain.  No nausea, no vomiting.  No diarrhea.  No constipation. Genitourinary: Negative for dysuria. Musculoskeletal: Negative for back pain. Skin: Negative for rash. Neurological: Negative for headaches, focal weakness or numbness.  10-point ROS otherwise negative.  ____________________________________________   PHYSICAL EXAM:  VITAL SIGNS: ED Triage Vitals  Enc Vitals Group     BP 05/28/15 1534 192/69 mmHg     Pulse Rate 05/28/15 1534 111     Resp 05/28/15 1534 20     Temp 05/28/15 1534 97.7 F (36.5 C)     Temp Source 05/28/15 1534 Oral     SpO2 05/28/15 1534 93 %     Weight 05/28/15 1534 113 lb (51.256 kg)     Height 05/28/15 1534 5' (1.524 m)     Head Cir --      Peak Flow --      Pain Score 05/28/15 1534 0     Pain Loc --      Pain Edu? --      Excl. in GC? --     Constitutional: Alert and oriented. Well appearing and in no acute distress. Eyes:  Conjunctivae are normal. PERRL. EOMI. Head: Atraumatic. Nose: No congestion/rhinnorhea. Mouth/Throat: Mucous membranes are moist.  Oropharynx non-erythematous. Neck: No stridor.   Hematological/Lymphatic/Immunilogical: Mild, nontender, mobile anterior cervical lymphadenopathy. Cardiovascular: Normal rate, regular rhythm. Grossly normal heart sounds.  Good peripheral circulation. Respiratory: Normal respiratory effort.  No retractions. On initial auscultation there was notable rhonchi bilateral upper lung fields. After patient coughed lung sounds with mild coarseness noted but no rhonchi. Gastrointestinal: Soft and nontender. No distention. No abdominal bruits. No CVA tenderness. Musculoskeletal: No lower extremity tenderness nor edema.  No joint effusions. Neurologic:  Normal speech and language. No gross focal neurologic deficits are appreciated. No gait instability. Skin:  Skin is warm, dry and intact. No rash noted. Psychiatric: Mood and affect are normal. Speech and behavior are normal.  ____________________________________________   LABS (all labs ordered are listed, but only abnormal results are displayed)  Labs Reviewed  BASIC METABOLIC PANEL - Abnormal; Notable for the following:    Glucose, Bld 315 (*)    BUN 33 (*)    Creatinine, Ser 1.10 (*)    GFR calc non Af Amer 45 (*)    GFR calc Af Amer 52 (*)    All other components within normal limits  CBC - Abnormal; Notable for the following:    WBC 14.4 (*)    RDW 15.8 (*)    All other components within normal limits  GLUCOSE, CAPILLARY - Abnormal; Notable for the following:    Glucose-Capillary 284 (*)    All other components within normal limits   ____________________________________________  EKG  EKG was a normal sinus rhythm, no elevations or depressions noted, no Q waves or delta waves present. ____________________________________________  RADIOLOGY  Two-view chest x-ray  Impression: No active cardiopulmonary  disease. Emphysema. ____________________________________________   PROCEDURES  Procedure(s) performed: None  Critical Care performed: No  ____________________________________________   INITIAL IMPRESSION / ASSESSMENT AND PLAN / ED COURSE  Pertinent labs & imaging results that were available during my care of the patient were reviewed by me and considered in my medical decision making (see chart for details).  The patient is an 81 year old female who presents to the emergency department complaining of a cough for 3 days. She denies any fever/chills, nasal congestion, sore throat, body aches. Patient's history, symptoms, exam, radiological findings, laboratory results most consistent with acute bronchitis. Advised patient of findings  and diagnosis. Gait patient instructions for treatment plan. The patient understands diagnosis and treatment plan. She verbalizes compliance with same. She will follow up with primary care provider for any further complaints. ____________________________________________   FINAL CLINICAL IMPRESSION(S) / ED DIAGNOSES  Final diagnoses:  Bronchitis      Racheal Patches, PA-C 05/28/15 1846  Emily Filbert, MD 05/28/15 1950

## 2015-05-28 NOTE — ED Notes (Signed)
Pt sent from PCP for cough, shortness of breath x3 days; sent here with copy of EKG. Pt denies any chest pain or fever.

## 2015-09-10 ENCOUNTER — Encounter: Payer: Self-pay | Admitting: *Deleted

## 2015-09-16 NOTE — Discharge Instructions (Signed)

## 2015-09-17 ENCOUNTER — Encounter
Admission: RE | Disposition: A | Discharge status: Home / Self Care | Payer: Self-pay | Source: Ambulatory Visit | Attending: Ophthalmology

## 2015-09-17 ENCOUNTER — Ambulatory Visit: Payer: Medicare Other | Admitting: Anesthesiology

## 2015-09-17 ENCOUNTER — Ambulatory Visit
Admission: RE | Admit: 2015-09-17 | Discharge: 2015-09-17 | Disposition: A | Discharge status: Home / Self Care | Payer: Medicare Other | Source: Ambulatory Visit | Attending: Ophthalmology | Admitting: Ophthalmology

## 2015-09-17 DIAGNOSIS — H2512 Age-related nuclear cataract, left eye: Secondary | ICD-10-CM | POA: Diagnosis present

## 2015-09-17 DIAGNOSIS — Z85828 Personal history of other malignant neoplasm of skin: Secondary | ICD-10-CM | POA: Insufficient documentation

## 2015-09-17 DIAGNOSIS — Z87891 Personal history of nicotine dependence: Secondary | ICD-10-CM | POA: Diagnosis not present

## 2015-09-17 DIAGNOSIS — K219 Gastro-esophageal reflux disease without esophagitis: Secondary | ICD-10-CM | POA: Insufficient documentation

## 2015-09-17 DIAGNOSIS — Z883 Allergy status to other anti-infective agents status: Secondary | ICD-10-CM | POA: Diagnosis not present

## 2015-09-17 DIAGNOSIS — R011 Cardiac murmur, unspecified: Secondary | ICD-10-CM | POA: Diagnosis not present

## 2015-09-17 DIAGNOSIS — I10 Essential (primary) hypertension: Secondary | ICD-10-CM | POA: Insufficient documentation

## 2015-09-17 DIAGNOSIS — Z880 Allergy status to penicillin: Secondary | ICD-10-CM | POA: Diagnosis not present

## 2015-09-17 DIAGNOSIS — Z90710 Acquired absence of both cervix and uterus: Secondary | ICD-10-CM | POA: Insufficient documentation

## 2015-09-17 DIAGNOSIS — E119 Type 2 diabetes mellitus without complications: Secondary | ICD-10-CM | POA: Diagnosis not present

## 2015-09-17 DIAGNOSIS — M109 Gout, unspecified: Secondary | ICD-10-CM | POA: Insufficient documentation

## 2015-09-17 DIAGNOSIS — M1990 Unspecified osteoarthritis, unspecified site: Secondary | ICD-10-CM | POA: Insufficient documentation

## 2015-09-17 HISTORY — DX: Gout, unspecified: M10.9

## 2015-09-17 HISTORY — DX: Personal history of other diseases of the digestive system: Z87.19

## 2015-09-17 HISTORY — PX: CATARACT EXTRACTION W/PHACO: SHX586

## 2015-09-17 HISTORY — DX: Cardiac murmur, unspecified: R01.1

## 2015-09-17 HISTORY — DX: Essential (primary) hypertension: I10

## 2015-09-17 HISTORY — DX: Aneurysm of unspecified site: I72.9

## 2015-09-17 HISTORY — DX: Unspecified osteoarthritis, unspecified site: M19.90

## 2015-09-17 HISTORY — DX: Gastro-esophageal reflux disease without esophagitis: K21.9

## 2015-09-17 LAB — GLUCOSE, CAPILLARY
GLUCOSE-CAPILLARY: 170 mg/dL — AB (ref 65–99)
Glucose-Capillary: 188 mg/dL — ABNORMAL HIGH (ref 65–99)

## 2015-09-17 SURGERY — PHACOEMULSIFICATION, CATARACT, WITH IOL INSERTION
Anesthesia: Monitor Anesthesia Care | Laterality: Left | Wound class: Clean

## 2015-09-17 MED ORDER — POVIDONE-IODINE 5 % OP SOLN
1.0000 "application " | OPHTHALMIC | Status: DC | PRN
  Administered 2015-09-17: 1 via OPHTHALMIC

## 2015-09-17 MED ORDER — TETRACAINE HCL 0.5 % OP SOLN
OPHTHALMIC | Status: DC | PRN
  Administered 2015-09-17: 2 [drp] via OPHTHALMIC

## 2015-09-17 MED ORDER — ARMC OPHTHALMIC DILATING GEL
1.0000 "application " | OPHTHALMIC | Status: DC | PRN
  Administered 2015-09-17 (×2): 1 via OPHTHALMIC

## 2015-09-17 MED ORDER — CEFUROXIME OPHTHALMIC INJECTION 1 MG/0.1 ML
INJECTION | OPHTHALMIC | Status: DC | PRN
  Administered 2015-09-17: 0.1 mL via OPHTHALMIC

## 2015-09-17 MED ORDER — BRIMONIDINE TARTRATE 0.2 % OP SOLN
OPHTHALMIC | Status: DC | PRN
  Administered 2015-09-17: 1 [drp] via OPHTHALMIC

## 2015-09-17 MED ORDER — LACTATED RINGERS IV SOLN: INTRAVENOUS | Status: DC

## 2015-09-17 MED ORDER — FENTANYL CITRATE (PF) 100 MCG/2ML IJ SOLN
INTRAMUSCULAR | Status: DC | PRN
  Administered 2015-09-17 (×2): 50 ug via INTRAVENOUS

## 2015-09-17 MED ORDER — EPINEPHRINE HCL 1 MG/ML IJ SOLN
INTRAMUSCULAR | Status: DC | PRN
  Administered 2015-09-17: 79 mL via OPHTHALMIC

## 2015-09-17 MED ORDER — NA HYALUR & NA CHOND-NA HYALUR 0.4-0.35 ML IO KIT
PACK | INTRAOCULAR | Status: DC | PRN
  Administered 2015-09-17: 1 mL via INTRAOCULAR

## 2015-09-17 MED ORDER — MIDAZOLAM HCL 2 MG/2ML IJ SOLN
INTRAMUSCULAR | Status: DC | PRN
  Administered 2015-09-17: 2 mg via INTRAVENOUS

## 2015-09-17 MED ORDER — TETRACAINE HCL 0.5 % OP SOLN
1.0000 [drp] | OPHTHALMIC | Status: DC | PRN
  Administered 2015-09-17: 1 [drp] via OPHTHALMIC

## 2015-09-17 MED ORDER — TIMOLOL MALEATE 0.5 % OP SOLN
OPHTHALMIC | Status: DC | PRN
  Administered 2015-09-17: 1 [drp] via OPHTHALMIC

## 2015-09-17 MED ORDER — ACETAMINOPHEN 160 MG/5ML PO SOLN: 325.0000 mg | ORAL | Status: DC | PRN

## 2015-09-17 MED ORDER — ACETAMINOPHEN 325 MG PO TABS: 325.0000 mg | ORAL_TABLET | ORAL | Status: DC | PRN

## 2015-09-17 SURGICAL SUPPLY — 29 items
CANNULA ANT/CHMB 27G (MISCELLANEOUS) ×1 IMPLANT
CANNULA ANT/CHMB 27GA (MISCELLANEOUS) ×3 IMPLANT
CARTRIDGE ABBOTT (MISCELLANEOUS) ×3 IMPLANT
GLOVE SURG LX 7.5 STRW (GLOVE) ×2
GLOVE SURG LX STRL 7.5 STRW (GLOVE) ×1 IMPLANT
GLOVE SURG TRIUMPH 8.0 PF LTX (GLOVE) ×3 IMPLANT
GOWN STRL REUS W/ TWL LRG LVL3 (GOWN DISPOSABLE) ×2 IMPLANT
GOWN STRL REUS W/TWL LRG LVL3 (GOWN DISPOSABLE) ×6
LENS IOL TECNIS 24.5 (Intraocular Lens) ×3 IMPLANT
LENS IOL TECNIS MONO 1P 24.5 (Intraocular Lens) IMPLANT
MARKER SKIN SURG W/RULER VIO (MISCELLANEOUS) ×3 IMPLANT
NDL FILTER BLUNT 18X1 1/2 (NEEDLE) ×1 IMPLANT
NDL RETROBULBAR .5 NSTRL (NEEDLE) IMPLANT
NEEDLE FILTER BLUNT 18X 1/2SAF (NEEDLE) ×2
NEEDLE FILTER BLUNT 18X1 1/2 (NEEDLE) ×1 IMPLANT
PACK CATARACT BRASINGTON (MISCELLANEOUS) ×3 IMPLANT
PACK EYE AFTER SURG (MISCELLANEOUS) ×3 IMPLANT
PACK OPTHALMIC (MISCELLANEOUS) ×3 IMPLANT
RING MALYGIN 7.0 (MISCELLANEOUS) IMPLANT
SUT ETHILON 10-0 CS-B-6CS-B-6 (SUTURE)
SUT VICRYL  9 0 (SUTURE)
SUT VICRYL 9 0 (SUTURE) IMPLANT
SUTURE EHLN 10-0 CS-B-6CS-B-6 (SUTURE) IMPLANT
SYR 3ML LL SCALE MARK (SYRINGE) ×3 IMPLANT
SYR 5ML LL (SYRINGE) IMPLANT
SYR TB 1ML LUER SLIP (SYRINGE) ×3 IMPLANT
WATER STERILE IRR 250ML POUR (IV SOLUTION) ×3 IMPLANT
WATER STERILE IRR 500ML POUR (IV SOLUTION) IMPLANT
WIPE NON LINTING 3.25X3.25 (MISCELLANEOUS) ×3 IMPLANT

## 2015-09-17 NOTE — Anesthesia Postprocedure Evaluation (Signed)
Anesthesia Post Note  Patient: Larrisa Cravey  Procedure(s) Performed: Procedure(s) (LRB): CATARACT EXTRACTION PHACO AND INTRAOCULAR LENS PLACEMENT (IOC) (Left)  Patient location during evaluation: PACU Anesthesia Type: MAC Level of consciousness: awake and alert and oriented Pain management: satisfactory to patient Vital Signs Assessment: post-procedure vital signs reviewed and stable Respiratory status: spontaneous breathing, nonlabored ventilation and respiratory function stable Cardiovascular status: blood pressure returned to baseline and stable Postop Assessment: Adequate PO intake and No signs of nausea or vomiting Anesthetic complications: no    Cherly Beach

## 2015-09-17 NOTE — Op Note (Signed)
OPERATIVE NOTE  Nancy Green [id number] 09/17/2015   PREOPERATIVE DIAGNOSIS:  Nuclear sclerotic cataract left eye. H25.12   POSTOPERATIVE DIAGNOSIS:    Nuclear sclerotic cataract left eye.     PROCEDURE:  Phacoemusification with posterior chamber intraocular lens placement of the left eye   LENS:   Implant Name Type Inv. Item Serial No. Manufacturer Lot No. LRB No. Used  LENS IMPL INTRAOC ZCB00 24.5 - [id number] Intraocular Lens LENS IMPL INTRAOC ZCB00 24.5 [id number] AMO   Left 1        ULTRASOUND TIME: 17  % of 1 minutes 11 seconds, CDE 12.2  SURGEON:  Deirdre Evener, MD   ANESTHESIA:  Topical with tetracaine drops and 2% Xylocaine jelly.   COMPLICATIONS:  None.   DESCRIPTION OF PROCEDURE:  The patient was identified in the holding room and transported to the operating room and placed in the supine position under the operating microscope.  The left eye was identified as the operative eye and it was prepped and draped in the usual sterile ophthalmic fashion.   A 1 millimeter clear-corneal paracentesis was made at the 1:30 position.  The anterior chamber was filled with Viscoat viscoelastic.  A 2.4 millimeter keratome was used to make a near-clear corneal incision at the 10:30 position.  .  A curvilinear capsulorrhexis was made with a cystotome and capsulorrhexis forceps.  Balanced salt solution was used to hydrodissect and hydrodelineate the nucleus.   Phacoemulsification was then used in stop and chop fashion to remove the lens nucleus and epinucleus.  The remaining cortex was then removed using the irrigation and aspiration handpiece. Provisc was then placed into the capsular bag to distend it for lens placement.  A lens was then injected into the capsular bag.  The remaining viscoelastic was aspirated.   Wounds were hydrated with balanced salt solution.  The anterior chamber was inflated to a physiologic pressure with balanced salt solution.  No wound leaks were  noted. Cefuroxime 0.1 ml of a /ml solution was injected into the anterior chamber for a dose of 1 mg of intracameral antibiotic at the completion of the case.   Timolol and Brimonidine drops were applied to the eye.  The patient was taken to the recovery room in stable condition without complications of anesthesia or surgery.  [name] 09/17/2015, 7:57 AM

## 2015-09-17 NOTE — H&P (Signed)
  The History and Physical notes are on paper, have been signed, and are to be scanned. The patient remains stable and unchanged from the H&P.   Previous H&P reviewed, patient examined, and there are no changes.  Nancy Green 09/17/2015 7:30 AM

## 2015-09-17 NOTE — Transfer of Care (Signed)
Immediate Anesthesia Transfer of Care Note  Patient: Nancy Green  Procedure(s) Performed: Procedure(s) with comments: CATARACT EXTRACTION PHACO AND INTRAOCULAR LENS PLACEMENT (IOC) (Left) - DIABETIC - insulin and oral meds  Patient Location: PACU  Anesthesia Type: MAC  Level of Consciousness: awake, alert  and patient cooperative  Airway and Oxygen Therapy: Patient Spontanous Breathing and Patient connected to supplemental oxygen  Post-op Assessment: Post-op Vital signs reviewed, Patient's Cardiovascular Status Stable, Respiratory Function Stable, Patent Airway and No signs of Nausea or vomiting  Post-op Vital Signs: Reviewed and stable  Complications: No apparent anesthesia complications

## 2015-09-17 NOTE — Anesthesia Procedure Notes (Signed)
Procedure Name: MAC Performed by: Joas Motton Pre-anesthesia Checklist: Patient identified, Emergency Drugs available, Suction available, Timeout performed and Patient being monitored Patient Re-evaluated:Patient Re-evaluated prior to inductionOxygen Delivery Method: Nasal cannula Placement Confirmation: positive ETCO2     

## 2015-09-17 NOTE — Anesthesia Preprocedure Evaluation (Signed)
Anesthesia Evaluation  Patient identified by MRN, date of birth, ID band  Reviewed: Allergy & Precautions, H&P , NPO status , Patient's Chart, lab work & pertinent test results  Airway Mallampati: II  TM Distance: >3 FB Neck ROM: full    Dental  (+) Upper Dentures, Lower Dentures   Pulmonary former smoker,    Pulmonary exam normal        Cardiovascular hypertension,  Rhythm:regular Rate:Normal     Neuro/Psych    GI/Hepatic GERD  ,  Endo/Other  diabetes  Renal/GU      Musculoskeletal   Abdominal   Peds  Hematology   Anesthesia Other Findings   Reproductive/Obstetrics                             Anesthesia Physical Anesthesia Plan  ASA: II  Anesthesia Plan: MAC   Post-op Pain Management:    Induction:   Airway Management Planned:   Additional Equipment:   Intra-op Plan:   Post-operative Plan:   Informed Consent: I have reviewed the patients History and Physical, chart, labs and discussed the procedure including the risks, benefits and alternatives for the proposed anesthesia with the patient or authorized representative who has indicated his/her understanding and acceptance.     Plan Discussed with: CRNA  Anesthesia Plan Comments:         Anesthesia Quick Evaluation

## 2015-09-18 ENCOUNTER — Encounter: Payer: Self-pay | Admitting: Ophthalmology

## 2015-10-14 ENCOUNTER — Encounter: Payer: Self-pay | Admitting: *Deleted

## 2015-10-17 NOTE — Discharge Instructions (Signed)

## 2015-10-22 ENCOUNTER — Ambulatory Visit: Payer: Medicare Other | Admitting: Anesthesiology

## 2015-10-22 ENCOUNTER — Ambulatory Visit
Admission: RE | Admit: 2015-10-22 | Discharge: 2015-10-22 | Disposition: A | Discharge status: Home / Self Care | Payer: Medicare Other | Source: Ambulatory Visit | Attending: Ophthalmology | Admitting: Ophthalmology

## 2015-10-22 ENCOUNTER — Encounter
Admission: RE | Disposition: A | Discharge status: Home / Self Care | Payer: Self-pay | Source: Ambulatory Visit | Attending: Ophthalmology

## 2015-10-22 DIAGNOSIS — H2511 Age-related nuclear cataract, right eye: Secondary | ICD-10-CM | POA: Diagnosis not present

## 2015-10-22 DIAGNOSIS — Z880 Allergy status to penicillin: Secondary | ICD-10-CM | POA: Diagnosis not present

## 2015-10-22 DIAGNOSIS — Z8679 Personal history of other diseases of the circulatory system: Secondary | ICD-10-CM | POA: Insufficient documentation

## 2015-10-22 DIAGNOSIS — K219 Gastro-esophageal reflux disease without esophagitis: Secondary | ICD-10-CM | POA: Diagnosis not present

## 2015-10-22 DIAGNOSIS — Z85828 Personal history of other malignant neoplasm of skin: Secondary | ICD-10-CM | POA: Diagnosis not present

## 2015-10-22 DIAGNOSIS — Z794 Long term (current) use of insulin: Secondary | ICD-10-CM | POA: Diagnosis not present

## 2015-10-22 DIAGNOSIS — Z7902 Long term (current) use of antithrombotics/antiplatelets: Secondary | ICD-10-CM | POA: Insufficient documentation

## 2015-10-22 DIAGNOSIS — Z8673 Personal history of transient ischemic attack (TIA), and cerebral infarction without residual deficits: Secondary | ICD-10-CM | POA: Diagnosis not present

## 2015-10-22 DIAGNOSIS — Z888 Allergy status to other drugs, medicaments and biological substances status: Secondary | ICD-10-CM | POA: Diagnosis not present

## 2015-10-22 DIAGNOSIS — Z9889 Other specified postprocedural states: Secondary | ICD-10-CM | POA: Diagnosis not present

## 2015-10-22 DIAGNOSIS — M109 Gout, unspecified: Secondary | ICD-10-CM | POA: Diagnosis not present

## 2015-10-22 DIAGNOSIS — Z9049 Acquired absence of other specified parts of digestive tract: Secondary | ICD-10-CM | POA: Insufficient documentation

## 2015-10-22 DIAGNOSIS — E119 Type 2 diabetes mellitus without complications: Secondary | ICD-10-CM | POA: Insufficient documentation

## 2015-10-22 DIAGNOSIS — Z87891 Personal history of nicotine dependence: Secondary | ICD-10-CM | POA: Insufficient documentation

## 2015-10-22 DIAGNOSIS — Z79899 Other long term (current) drug therapy: Secondary | ICD-10-CM | POA: Diagnosis not present

## 2015-10-22 DIAGNOSIS — I10 Essential (primary) hypertension: Secondary | ICD-10-CM | POA: Diagnosis not present

## 2015-10-22 DIAGNOSIS — Z9109 Other allergy status, other than to drugs and biological substances: Secondary | ICD-10-CM | POA: Diagnosis not present

## 2015-10-22 DIAGNOSIS — H269 Unspecified cataract: Secondary | ICD-10-CM | POA: Diagnosis present

## 2015-10-22 HISTORY — PX: CATARACT EXTRACTION W/PHACO: SHX586

## 2015-10-22 LAB — GLUCOSE, CAPILLARY
Glucose-Capillary: 150 mg/dL — ABNORMAL HIGH (ref 65–99)
Glucose-Capillary: 187 mg/dL — ABNORMAL HIGH (ref 65–99)

## 2015-10-22 SURGERY — PHACOEMULSIFICATION, CATARACT, WITH IOL INSERTION
Anesthesia: Monitor Anesthesia Care | Site: Eye | Laterality: Right | Wound class: Clean

## 2015-10-22 MED ORDER — LIDOCAINE HCL (PF) 4 % IJ SOLN
INTRAOCULAR | Status: DC | PRN
  Administered 2015-10-22: 1 mL via OPHTHALMIC

## 2015-10-22 MED ORDER — BRIMONIDINE TARTRATE 0.2 % OP SOLN
OPHTHALMIC | Status: DC | PRN
  Administered 2015-10-22: 1 [drp] via OPHTHALMIC

## 2015-10-22 MED ORDER — POVIDONE-IODINE 5 % OP SOLN
1.0000 "application " | OPHTHALMIC | Status: DC | PRN
  Administered 2015-10-22: 1 via OPHTHALMIC

## 2015-10-22 MED ORDER — LACTATED RINGERS IV SOLN
INTRAVENOUS | Status: DC
Start: 2015-10-22 — End: 2015-10-22

## 2015-10-22 MED ORDER — NA HYALUR & NA CHOND-NA HYALUR 0.4-0.35 ML IO KIT
PACK | INTRAOCULAR | Status: DC | PRN
  Administered 2015-10-22: 1 mL via INTRAOCULAR

## 2015-10-22 MED ORDER — FENTANYL CITRATE (PF) 100 MCG/2ML IJ SOLN
INTRAMUSCULAR | Status: DC | PRN
  Administered 2015-10-22: 50 ug via INTRAVENOUS

## 2015-10-22 MED ORDER — MIDAZOLAM HCL 2 MG/2ML IJ SOLN
INTRAMUSCULAR | Status: DC | PRN
  Administered 2015-10-22: 1 mg via INTRAVENOUS

## 2015-10-22 MED ORDER — CEFUROXIME OPHTHALMIC INJECTION 1 MG/0.1 ML
INJECTION | OPHTHALMIC | Status: DC | PRN
Start: 2015-10-22 — End: 2015-10-22
  Administered 2015-10-22: 0.1 mL via INTRACAMERAL

## 2015-10-22 MED ORDER — BSS IO SOLN
INTRAOCULAR | Status: DC | PRN
  Administered 2015-10-22: 76 mL via OPHTHALMIC

## 2015-10-22 MED ORDER — ARMC OPHTHALMIC DILATING GEL
1.0000 | OPHTHALMIC | Status: DC | PRN
Start: 2015-10-22 — End: 2015-10-22
  Administered 2015-10-22 (×2): 1 via OPHTHALMIC

## 2015-10-22 MED ORDER — TIMOLOL MALEATE 0.5 % OP SOLN
OPHTHALMIC | Status: DC | PRN
  Administered 2015-10-22: 1 [drp] via OPHTHALMIC

## 2015-10-22 MED ORDER — TETRACAINE HCL 0.5 % OP SOLN
1.0000 [drp] | OPHTHALMIC | Status: DC | PRN
  Administered 2015-10-22: 1 [drp] via OPHTHALMIC

## 2015-10-22 SURGICAL SUPPLY — 29 items
CANNULA ANT/CHMB 27G (MISCELLANEOUS) ×1 IMPLANT
CANNULA ANT/CHMB 27GA (MISCELLANEOUS) ×3 IMPLANT
CARTRIDGE ABBOTT (MISCELLANEOUS) ×3 IMPLANT
GLOVE SURG LX 7.5 STRW (GLOVE) ×2
GLOVE SURG LX STRL 7.5 STRW (GLOVE) ×1 IMPLANT
GLOVE SURG TRIUMPH 8.0 PF LTX (GLOVE) ×3 IMPLANT
GOWN STRL REUS W/ TWL LRG LVL3 (GOWN DISPOSABLE) ×2 IMPLANT
GOWN STRL REUS W/TWL LRG LVL3 (GOWN DISPOSABLE) ×6
LENS IOL TECNIS 24.0 (Intraocular Lens) ×3 IMPLANT
LENS IOL TECNIS MONO 1P 24.0 (Intraocular Lens) IMPLANT
MARKER SKIN DUAL TIP RULER LAB (MISCELLANEOUS) ×3 IMPLANT
NDL FILTER BLUNT 18X1 1/2 (NEEDLE) ×1 IMPLANT
NDL RETROBULBAR .5 NSTRL (NEEDLE) IMPLANT
NEEDLE FILTER BLUNT 18X 1/2SAF (NEEDLE) ×2
NEEDLE FILTER BLUNT 18X1 1/2 (NEEDLE) ×1 IMPLANT
PACK CATARACT BRASINGTON (MISCELLANEOUS) ×3 IMPLANT
PACK EYE AFTER SURG (MISCELLANEOUS) ×3 IMPLANT
PACK OPTHALMIC (MISCELLANEOUS) ×3 IMPLANT
RING MALYGIN 7.0 (MISCELLANEOUS) IMPLANT
SUT ETHILON 10-0 CS-B-6CS-B-6 (SUTURE)
SUT VICRYL  9 0 (SUTURE)
SUT VICRYL 9 0 (SUTURE) IMPLANT
SUTURE EHLN 10-0 CS-B-6CS-B-6 (SUTURE) IMPLANT
SYR 3ML LL SCALE MARK (SYRINGE) ×3 IMPLANT
SYR 5ML LL (SYRINGE) IMPLANT
SYR TB 1ML LUER SLIP (SYRINGE) ×3 IMPLANT
WATER STERILE IRR 250ML POUR (IV SOLUTION) ×3 IMPLANT
WATER STERILE IRR 500ML POUR (IV SOLUTION) IMPLANT
WIPE NON LINTING 3.25X3.25 (MISCELLANEOUS) ×3 IMPLANT

## 2015-10-22 NOTE — Anesthesia Preprocedure Evaluation (Signed)
Anesthesia Evaluation    Airway Mallampati: II  TM Distance: >3 FB     Dental   Pulmonary former smoker,    breath sounds clear to auscultation       Cardiovascular hypertension, Pt. on medications  Rate:Normal     Neuro/Psych    GI/Hepatic hiatal hernia, GERD  Controlled,  Endo/Other  diabetes, Well Controlled, Type 2  Renal/GU      Musculoskeletal  (+) Arthritis ,   Abdominal   Peds  Hematology   Anesthesia Other Findings   Reproductive/Obstetrics                             Anesthesia Physical Anesthesia Plan  ASA: II  Anesthesia Plan: MAC   Post-op Pain Management:    Induction:   Airway Management Planned:   Additional Equipment:   Intra-op Plan:   Post-operative Plan:   Informed Consent: I have reviewed the patients History and Physical, chart, labs and discussed the procedure including the risks, benefits and alternatives for the proposed anesthesia with the patient or authorized representative who has indicated his/her understanding and acceptance.     Plan Discussed with: CRNA  Anesthesia Plan Comments:         Anesthesia Quick Evaluation

## 2015-10-22 NOTE — H&P (Signed)
  The History and Physical notes are on paper, have been signed, and are to be scanned. The patient remains stable and unchanged from the H&P.   Previous H&P reviewed, patient examined, and there are no changes.  Nancy Green 10/22/2015 7:32 AM

## 2015-10-22 NOTE — Op Note (Signed)
LOCATION:  [facility]   PREOPERATIVE DIAGNOSIS:    Nuclear sclerotic cataract right eye. H25.11   POSTOPERATIVE DIAGNOSIS:  Nuclear sclerotic cataract right eye.     PROCEDURE:  Phacoemusification with posterior chamber intraocular lens placement of the right eye   LENS:   Implant Name Type Inv. Item Serial No. Manufacturer Lot No. LRB No. Used  LENS IOL TECNIS 24.0 - [id number] Intraocular Lens LENS IOL TECNIS 24.0 [id number] AMO   Right 1        ULTRASOUND TIME: 15 % of 1 minutes, 19 seconds.  CDE 11.7   SURGEON:  Deirdre Evener, MD   ANESTHESIA:  Topical with tetracaine drops and 2% Xylocaine jelly, augmented with 1% preservative-free intracameral lidocaine.    COMPLICATIONS:  None.   DESCRIPTION OF PROCEDURE:  The patient was identified in the holding room and transported to the operating room and placed in the supine position under the operating microscope.  The right eye was identified as the operative eye and it was prepped and draped in the usual sterile ophthalmic fashion.   A 1 millimeter clear-corneal paracentesis was made at the 12:00 position.  0.5 ml of preservative-free 1% lidocaine was injected into the anterior chamber. The anterior chamber was filled with Viscoat viscoelastic.  A 2.4 millimeter keratome was used to make a near-clear corneal incision at the 9:00 position.  A curvilinear capsulorrhexis was made with a cystotome and capsulorrhexis forceps.  Balanced salt solution was used to hydrodissect and hydrodelineate the nucleus.   Phacoemulsification was then used in stop and chop fashion to remove the lens nucleus and epinucleus.  The remaining cortex was then removed using the irrigation and aspiration handpiece. Provisc was then placed into the capsular bag to distend it for lens placement.  A lens was then injected into the capsular bag.  The remaining viscoelastic was aspirated.   Wounds were hydrated with balanced salt solution.  The  anterior chamber was inflated to a physiologic pressure with balanced salt solution.  No wound leaks were noted. Cefuroxime 0.1 ml of a /ml solution was injected into the anterior chamber for a dose of 1 mg of intracameral antibiotic at the completion of the case.   Timolol and Brimonidine drops were applied to the eye.  The patient was taken to the recovery room in stable condition without complications of anesthesia or surgery.   Pharrah Rottman 10/22/2015, 7:59 AM

## 2015-10-22 NOTE — Anesthesia Procedure Notes (Signed)
Procedure Name: MAC Performed by: Joslyne Marshburn Pre-anesthesia Checklist: Patient identified, Emergency Drugs available, Suction available, Patient being monitored and Timeout performed Patient Re-evaluated:Patient Re-evaluated prior to inductionOxygen Delivery Method: Nasal cannula     

## 2015-10-22 NOTE — Anesthesia Postprocedure Evaluation (Signed)
Anesthesia Post Note  Patient: Nancy Green  Procedure(s) Performed: Procedure(s) (LRB): CATARACT EXTRACTION PHACO AND INTRAOCULAR LENS PLACEMENT (IOC) (Right)  Patient location during evaluation: PACU Anesthesia Type: General Level of consciousness: awake and alert Pain management: pain level controlled Vital Signs Assessment: post-procedure vital signs reviewed and stable Respiratory status: spontaneous breathing, nonlabored ventilation, respiratory function stable and patient connected to nasal cannula oxygen Cardiovascular status: blood pressure returned to baseline and stable Postop Assessment: no signs of nausea or vomiting Anesthetic complications: no    Dorene Grebe

## 2015-10-22 NOTE — Transfer of Care (Signed)
Immediate Anesthesia Transfer of Care Note  Patient: Nancy Green  Procedure(s) Performed: Procedure(s) with comments: CATARACT EXTRACTION PHACO AND INTRAOCULAR LENS PLACEMENT (IOC) (Right) - DIABETIC - insulin and oral meds  Patient Location: PACU  Anesthesia Type: MAC  Level of Consciousness: awake, alert  and patient cooperative  Airway and Oxygen Therapy: Patient Spontanous Breathing and Patient connected to supplemental oxygen  Post-op Assessment: Post-op Vital signs reviewed, Patient's Cardiovascular Status Stable, Respiratory Function Stable, Patent Airway and No signs of Nausea or vomiting  Post-op Vital Signs: Reviewed and stable  Complications: No apparent anesthesia complications

## 2015-10-23 ENCOUNTER — Encounter: Payer: Self-pay | Admitting: Ophthalmology

## 2015-11-10 IMAGING — CR DG CHEST 2V
2 series · 2 of 2 positions shown · non-contrast
Comparison: 11/26/2013

CLINICAL DATA: Cough for 9 days.

EXAM:
CHEST  2 VIEW

[chest pa]
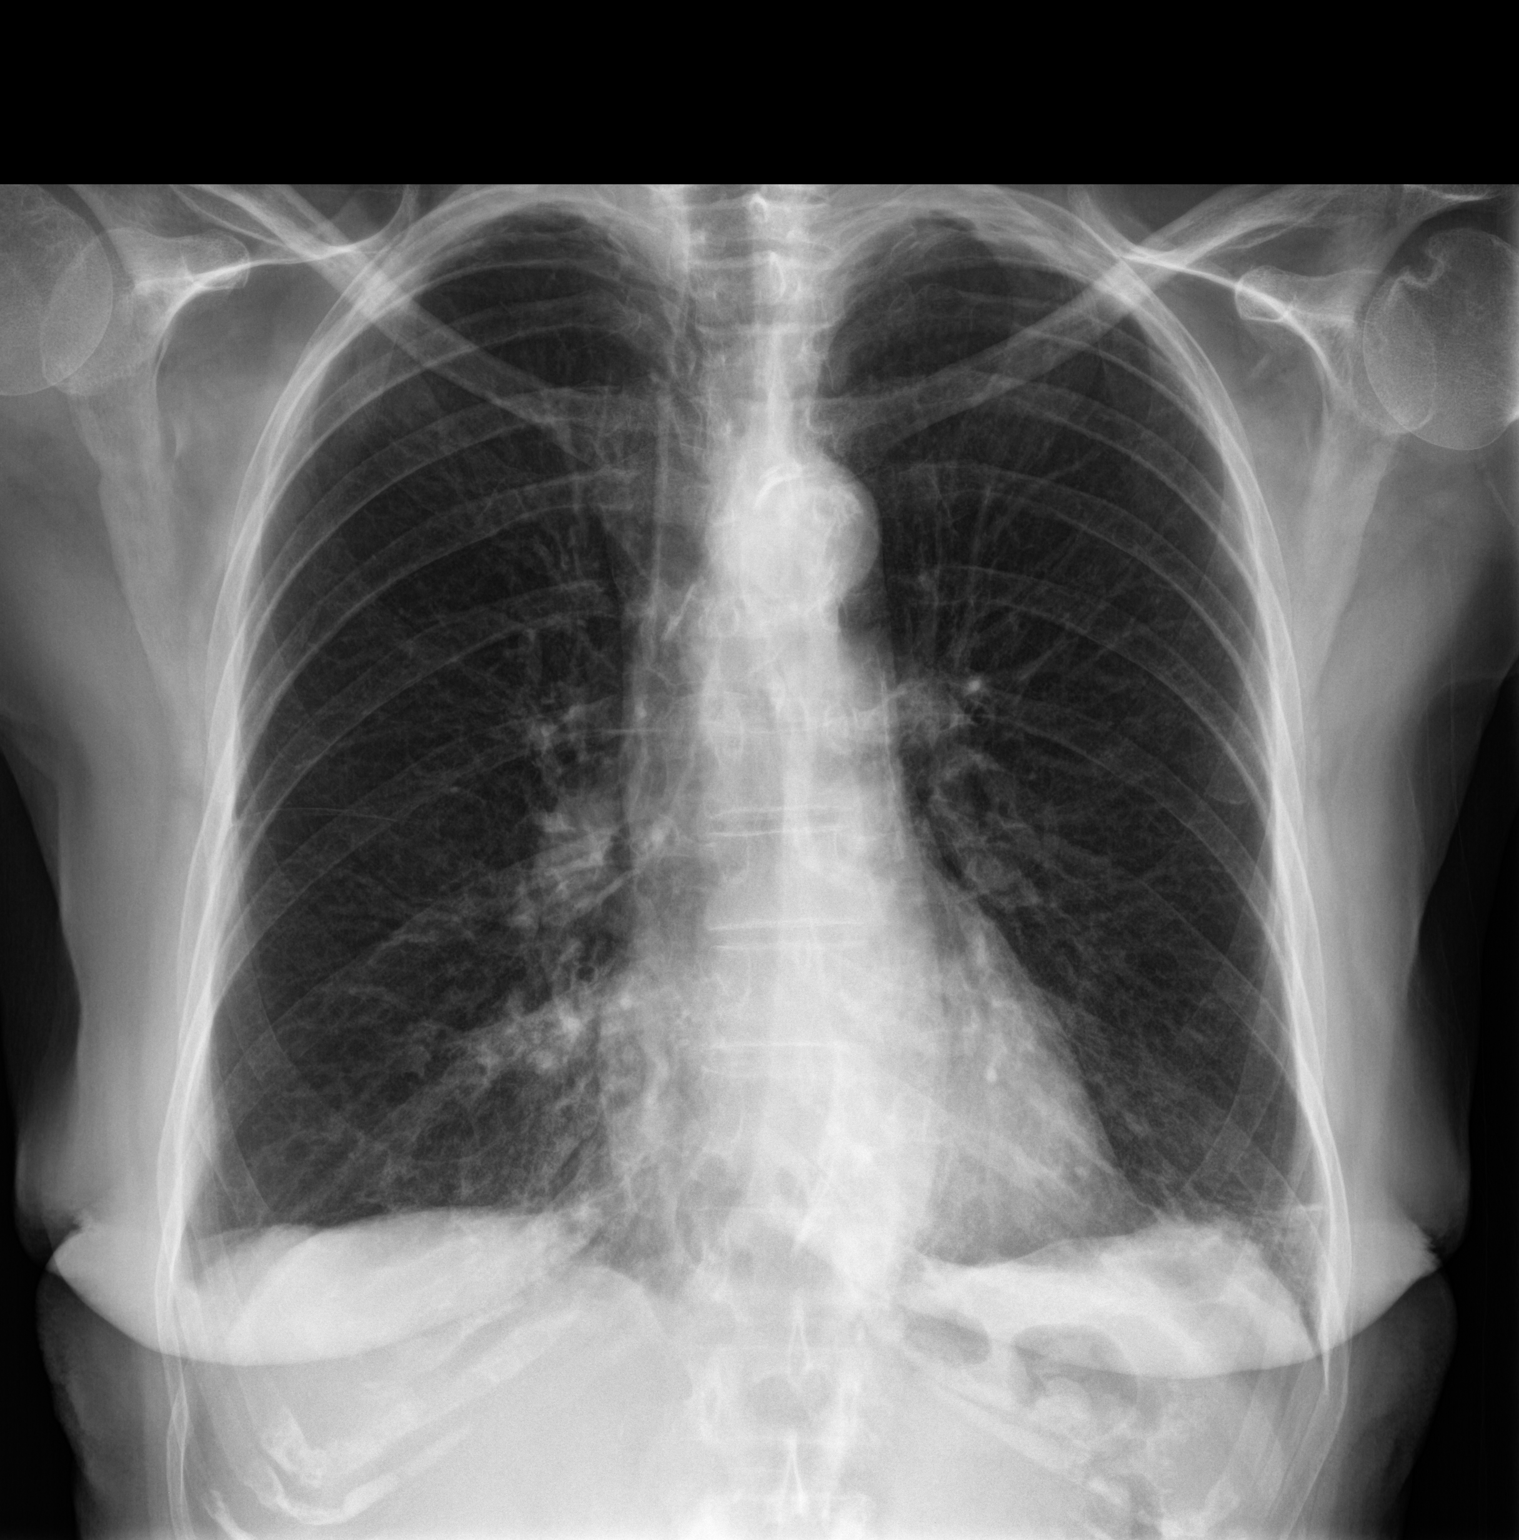

[chest lat]
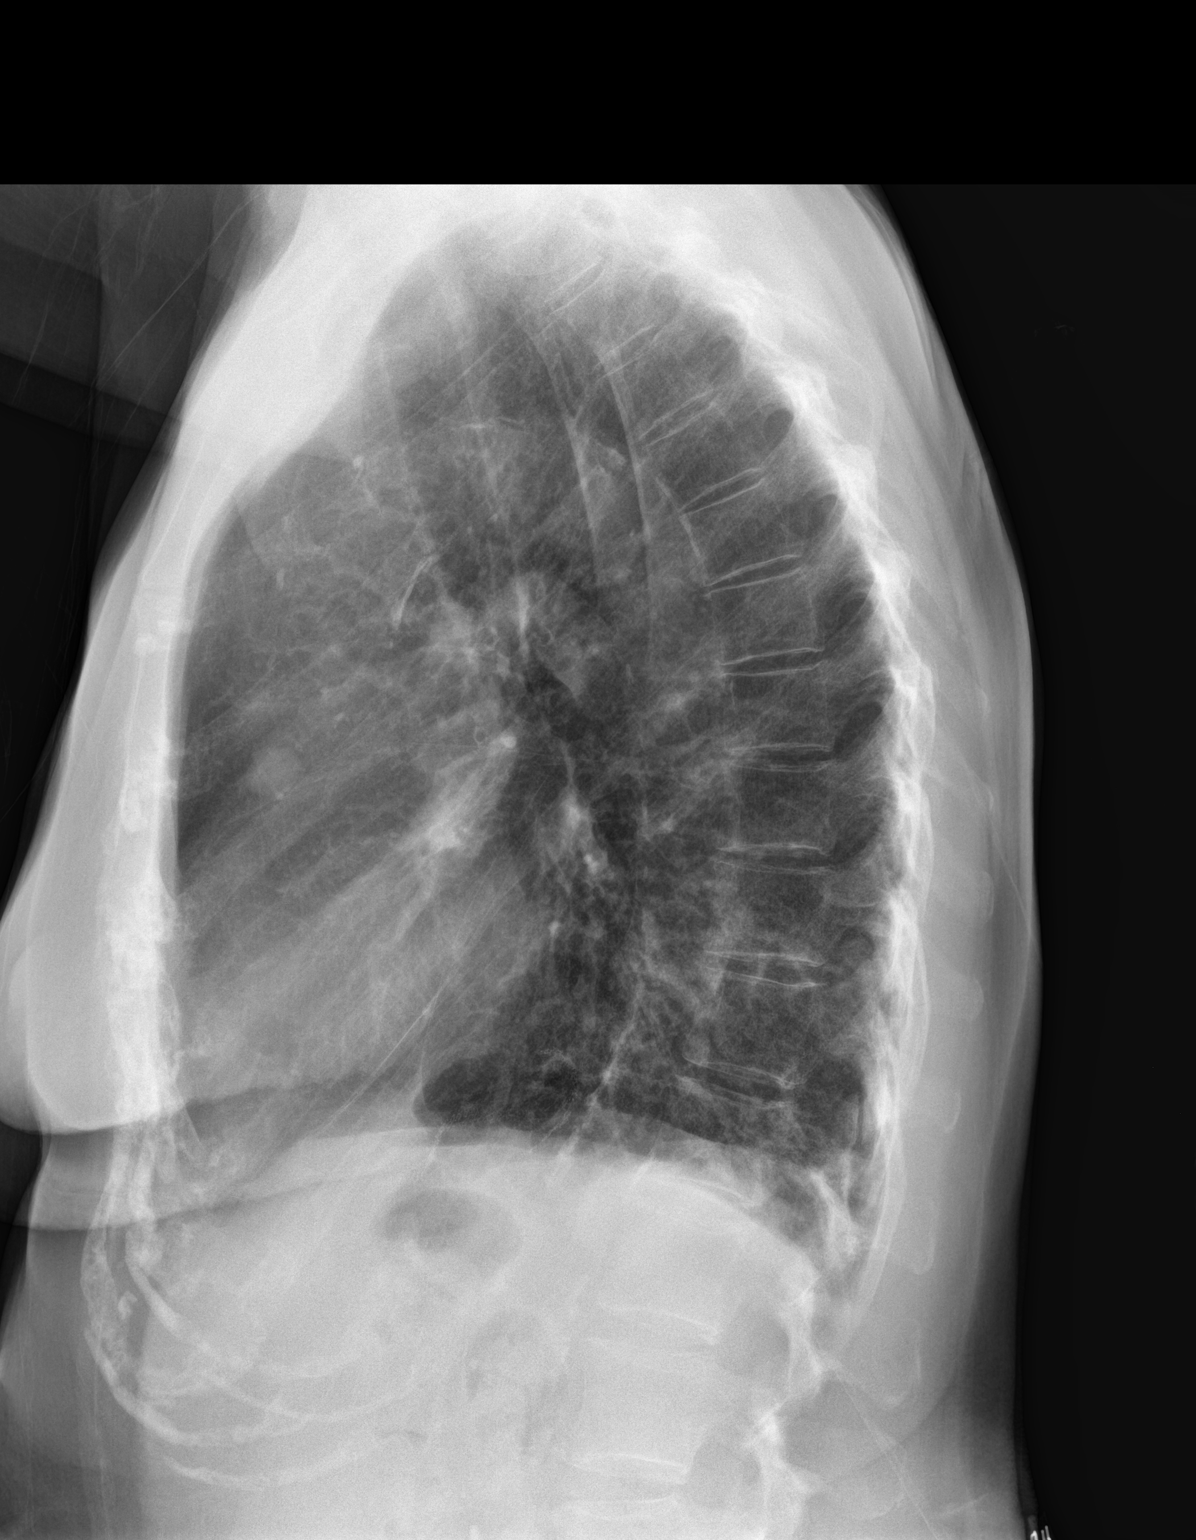

[2 of 2 positions shown; findings below may reference images not displayed]

FINDINGS: There is hyperinflation of the lungs compatible with COPD. Heart is
normal size. Mediastinal contours are within normal limits. Scarring
in the apices and lung bases. No confluent opacities or effusions.
No acute bony abnormality.
IMPRESSION: COPD/ chronic changes.  No active disease.

## 2016-08-31 ENCOUNTER — Ambulatory Visit (INDEPENDENT_AMBULATORY_CARE_PROVIDER_SITE_OTHER): Payer: Medicare Other | Admitting: Vascular Surgery

## 2016-08-31 ENCOUNTER — Encounter (INDEPENDENT_AMBULATORY_CARE_PROVIDER_SITE_OTHER): Payer: Self-pay | Admitting: Vascular Surgery

## 2016-08-31 VITALS — BP 153/62 | HR 100 | Resp 16 | Ht 60.0 in | Wt 123.0 lb

## 2016-08-31 DIAGNOSIS — R42 Dizziness and giddiness: Secondary | ICD-10-CM

## 2016-08-31 DIAGNOSIS — I6529 Occlusion and stenosis of unspecified carotid artery: Secondary | ICD-10-CM | POA: Insufficient documentation

## 2016-08-31 DIAGNOSIS — E119 Type 2 diabetes mellitus without complications: Secondary | ICD-10-CM | POA: Diagnosis not present

## 2016-08-31 DIAGNOSIS — I6521 Occlusion and stenosis of right carotid artery: Secondary | ICD-10-CM | POA: Diagnosis not present

## 2016-08-31 HISTORY — DX: Occlusion and stenosis of unspecified carotid artery: I65.29

## 2016-08-31 NOTE — Assessment & Plan Note (Signed)
The patient had a carotid bruit detected by her primary care physician which prompted an ultrasound recently. Although I do not have the study for review today, review of her last note with her primary care physician quoted a 50-69% right carotid artery stenosis. I discussed with her the pathophysiology and natural history of carotid disease. I discussed the general recommendation for carotid stenosis in the moderate range to be treated with antiplatelet therapy and continued surveillance. She will return in 6 months with a carotid duplex for further evaluation. We discussed the addition of an 81 mg aspirin to her 75 mg Plavix. We also discussed the recent prescription for a statin agent she received, and discussed that beginning this may be of benefit as well although I will defer to her primary care physician. We discussed signs and symptoms of stroke and TIA. I will see the patient back in 6 months with her duplex.

## 2016-08-31 NOTE — Assessment & Plan Note (Signed)
blood glucose control important in reducing the progression of atherosclerotic disease. Also, involved in wound healing. On appropriate medications.  

## 2016-08-31 NOTE — Assessment & Plan Note (Signed)
Unlikely due to carotid disease.

## 2016-08-31 NOTE — Patient Instructions (Signed)

## 2016-08-31 NOTE — Progress Notes (Signed)
Patient ID: Nancy Green, female   DOB: 1930-01-28, 83 y.o.   MRN: [id number]  Chief Complaint  Patient presents with  . New Evaluation    Right carotid stenosis    HPI Nancy Green is a 83 y.o. female.  I am asked to see the patient by Lenon OmsSarah Gauger, NP for evaluation of carotid artery stenosis.  The patient reports Dizziness but no focal neurologic symptoms. Specifically, the patient denies amaurosis fugax, speech or swallowing difficulties, or arm or leg weakness or numbness. She was told over 20 years ago she may have had a light stroke, but this was never confirmed. She did have a cerebral aneurysm removed many years ago with craniotomy on the right. She does not have residual deficits after that. I do not have the carotid duplex for review today, but by report she had a 50-69% right carotid artery stenosis present. She is already on Plavix.   Past Medical History:  Diagnosis Date  . Aneurysm (HCC)    removed yrs ago - craniotomy  . Arthritis    fingers  . Diabetes mellitus without complication (HCC)   . GERD (gastroesophageal reflux disease)   . Gout   . Heart murmur    followed by PCP  . History of hiatal hernia    "repaired"  . Hypertension   . Legal blindness of left eye, as defined in [place]     Past Surgical History:  Procedure Laterality Date  . ABDOMINAL HYSTERECTOMY    . APPENDECTOMY    . CATARACT EXTRACTION W/PHACO Left 09/17/2015   Procedure: CATARACT EXTRACTION PHACO AND INTRAOCULAR LENS PLACEMENT (IOC);  Surgeon: Lockie Molahadwick Brasington, MD;  [facility];  Service: Ophthalmology;  Laterality: Left;  DIABETIC - insulin and oral meds  . CATARACT EXTRACTION W/PHACO Right 10/22/2015   Procedure: CATARACT EXTRACTION PHACO AND INTRAOCULAR LENS PLACEMENT (IOC);  Surgeon: Lockie Molahadwick Brasington, MD;  [facility];  Service: Ophthalmology;  Laterality: Right;  DIABETIC - insulin and oral meds  . CHOLECYSTECTOMY    .  COLONOSCOPY    . CRANIOTOMY  1967  . hiatel hernia     . RECTAL SURGERY      Family history No bleeding disorders, clotting disorders, autoimmune diseases, or aneurysms  Social History Social History  Substance Use Topics  . Smoking status: Former Games developermoker  . Smokeless tobacco: Not on file     Comment: quit 20+ yrs ago  . Alcohol use No  No IV drug use  Allergies  Allergen Reactions  . Neomycin Rash  . Zantac [Ranitidine Hcl] Hives       . Penicillin G Rash    Current Outpatient Prescriptions  Medication Sig Dispense Refill  . albuterol (PROVENTIL HFA;VENTOLIN HFA) 108 (90 BASE) MCG/ACT inhaler Inhale 2 puffs into the lungs every 4 (four) hours as needed for wheezing or shortness of breath. 1 Inhaler 0  . allopurinol (ZYLOPRIM) 100 MG tablet Take 100 mg by mouth daily.    . clopidogrel (PLAVIX) 75 MG tablet Take 75 mg by mouth daily.    . diazepam (VALIUM) 5 MG tablet Take 5 mg by mouth as needed for anxiety.    . insulin aspart (NOVOLOG FLEXPEN) 100 UNIT/ML FlexPen Inject into the skin 3 (three) times daily with meals.    Marland Kitchen. loratadine (CLARITIN) 10 MG tablet Take 10 mg by mouth daily as needed for allergies.    Marland Kitchen. losartan (COZAAR) 100 MG tablet Take 100 mg by mouth daily.    .Marland Kitchen  metFORMIN (GLUCOPHAGE) 500 MG tablet Take by mouth at bedtime.    Marland Kitchen NIFEdipine (PROCARDIA XL/ADALAT-CC) 60 MG 24 hr tablet Take 60 mg by mouth daily.    Marland Kitchen NIFEdipine (PROCARDIA-XL/ADALAT CC) 60 MG 24 hr tablet Take 60 mg by mouth daily. Reported on 10/14/2015    . omeprazole (PRILOSEC) 40 MG capsule Take 40 mg by mouth daily.    . predniSONE (DELTASONE) 10 MG tablet Take 12.5 mg by mouth daily with breakfast.     . triamterene-hydrochlorothiazide (DYAZIDE) 37.5-25 MG per capsule Take 1 capsule by mouth daily.      No current facility-administered medications for this visit.       REVIEW OF SYSTEMS (Negative unless checked)  Constitutional: [] Weight loss  [] Fever  [] Chills Cardiac: [] Chest pain    [] Chest pressure   [] Palpitations   [] Shortness of breath when laying flat   [] Shortness of breath at rest   [] Shortness of breath with exertion. Vascular:  [] Pain in legs with walking   [] Pain in legs at rest   [] Pain in legs when laying flat   [] Claudication   [] Pain in feet when walking  [] Pain in feet at rest  [] Pain in feet when laying flat   [] History of DVT   [] Phlebitis   [] Swelling in legs   [] Varicose veins   [] Non-healing ulcers Pulmonary:   [] Uses home oxygen   [] Productive cough   [] Hemoptysis   [] Wheeze  [] COPD   [] Asthma Neurologic:  [x] Dizziness  [] Blackouts   [] Seizures   [] History of stroke   [] History of TIA  [] Aphasia   [] Temporary blindness   [] Dysphagia   [] Weakness or numbness in arms   [] Weakness or numbness in legs Musculoskeletal:  [x] Arthritis   [] Joint swelling   [] Joint pain   [] Low back pain Hematologic:  [] Easy bruising  [] Easy bleeding   [] Hypercoagulable state   [] Anemic  [] Hepatitis Gastrointestinal:  [] Blood in stool   [] Vomiting blood  [] Gastroesophageal reflux/heartburn   [] Abdominal pain Genitourinary:  [] Chronic kidney disease   [] Difficult urination  [] Frequent urination  [] Burning with urination   [] Hematuria Skin:  [] Rashes   [] Ulcers   [] Wounds Psychological:  [] History of anxiety   []  History of major depression.    Physical Exam BP (!) 153/62 (BP Location: Left Arm)   Pulse 100   Resp 16   Ht 5' (1.524 m)   Wt 123 lb (55.8 kg)   BMI 24.02 kg/m  Gen:  WD/WN, NAD. Appears younger than stated age Head: [place]/AT, No temporalis wasting. Prominent temp pulse not noted. Ear/Nose/Throat: Hearing grossly intact, nares w/o erythema or drainage, oropharynx w/o Erythema/Exudate Eyes: Conjunctiva clear, sclera non-icteric  Neck: trachea midline.  No JVD. Right carotid bruit present Pulmonary:  Good air movement, clear to auscultation bilaterally.  Cardiac: RRR, normal S1, S2, no Murmurs, rubs or gallops. Vascular:  Vessel Right Left  Radial Palpable Palpable   Ulnar Palpable Palpable  Brachial Palpable Palpable  Carotid Palpable, without bruit Palpable, without bruit  Aorta Not palpable N/A  Femoral Palpable Palpable  Popliteal Palpable Palpable  PT Palpable 1+ Palpable  DP Not Palpable Palpable   Gastrointestinal: soft, non-tender/non-distended. No guarding/reflex. No masses, surgical incisions, or scars. Musculoskeletal: M/S 5/5 throughout.  Extremities without ischemic changes.  No deformity or atrophy.  Neurologic: Sensation grossly intact in extremities.  Symmetrical.  Speech is fluent. Motor exam as listed above. Psychiatric: Judgment intact, Mood & affect appropriate for pt's clinical situation. Dermatologic: No rashes or ulcers noted.  No cellulitis or  open wounds. Lymph : No Cervical, Axillary, or Inguinal lymphadenopathy.   Radiology No results found.  Labs No results found for this or any previous visit (from the past 2160 hour(s)).  Assessment/Plan:  Diabetes (HCC) blood glucose control important in reducing the progression of atherosclerotic disease. Also, involved in wound healing. On appropriate medications.   Dizziness and giddiness Unlikely due to carotid disease.    Carotid stenosis The patient had a carotid bruit detected by her primary care physician which prompted an ultrasound recently. Although I do not have the study for review today, review of her last note with her primary care physician quoted a 50-69% right carotid artery stenosis. I discussed with her the pathophysiology and natural history of carotid disease. I discussed the general recommendation for carotid stenosis in the moderate range to be treated with antiplatelet therapy and continued surveillance. She will return in 6 months with a carotid duplex for further evaluation. We discussed the addition of an 81 mg aspirin to her 75 mg Plavix. We also discussed the recent prescription for a statin agent she received, and discussed that beginning this may be  of benefit as well although I will defer to her primary care physician. We discussed signs and symptoms of stroke and TIA. I will see the patient back in 6 months with her duplex.      Festus Barren 08/31/2016, 1:50 PM   This note was created with Dragon medical transcription system.  Any errors from dictation are unintentional.

## 2017-01-17 ENCOUNTER — Other Ambulatory Visit: Payer: Self-pay | Admitting: Rheumatology

## 2017-01-17 DIAGNOSIS — M79604 Pain in right leg: Secondary | ICD-10-CM

## 2017-01-17 DIAGNOSIS — M79605 Pain in left leg: Principal | ICD-10-CM

## 2017-01-20 ENCOUNTER — Ambulatory Visit
Admission: RE | Admit: 2017-01-20 | Discharge: 2017-01-20 | Disposition: A | Discharge status: Home / Self Care | Payer: Medicare Other | Source: Ambulatory Visit | Attending: Rheumatology | Admitting: Rheumatology

## 2017-01-20 DIAGNOSIS — M48061 Spinal stenosis, lumbar region without neurogenic claudication: Secondary | ICD-10-CM | POA: Diagnosis not present

## 2017-01-20 DIAGNOSIS — M47816 Spondylosis without myelopathy or radiculopathy, lumbar region: Secondary | ICD-10-CM | POA: Insufficient documentation

## 2017-01-20 DIAGNOSIS — M79604 Pain in right leg: Secondary | ICD-10-CM | POA: Diagnosis not present

## 2017-01-20 DIAGNOSIS — M79605 Pain in left leg: Secondary | ICD-10-CM | POA: Insufficient documentation

## 2017-01-25 ENCOUNTER — Other Ambulatory Visit: Payer: Self-pay | Admitting: Rheumatology

## 2017-01-25 DIAGNOSIS — M79604 Pain in right leg: Secondary | ICD-10-CM

## 2017-01-25 DIAGNOSIS — M316 Other giant cell arteritis: Secondary | ICD-10-CM

## 2017-01-25 DIAGNOSIS — M79605 Pain in left leg: Principal | ICD-10-CM

## 2017-01-25 DIAGNOSIS — M150 Primary generalized (osteo)arthritis: Secondary | ICD-10-CM

## 2017-01-25 DIAGNOSIS — M159 Polyosteoarthritis, unspecified: Secondary | ICD-10-CM

## 2017-01-25 DIAGNOSIS — M810 Age-related osteoporosis without current pathological fracture: Secondary | ICD-10-CM

## 2017-02-02 ENCOUNTER — Ambulatory Visit (INDEPENDENT_AMBULATORY_CARE_PROVIDER_SITE_OTHER): Payer: Medicare Other | Admitting: Vascular Surgery

## 2017-02-02 ENCOUNTER — Encounter (INDEPENDENT_AMBULATORY_CARE_PROVIDER_SITE_OTHER): Payer: Medicare Other

## 2017-02-10 ENCOUNTER — Encounter (INDEPENDENT_AMBULATORY_CARE_PROVIDER_SITE_OTHER): Payer: Self-pay | Admitting: Vascular Surgery

## 2017-02-10 ENCOUNTER — Ambulatory Visit (INDEPENDENT_AMBULATORY_CARE_PROVIDER_SITE_OTHER): Payer: Medicare Other

## 2017-02-10 ENCOUNTER — Ambulatory Visit (INDEPENDENT_AMBULATORY_CARE_PROVIDER_SITE_OTHER): Payer: Medicare Other | Admitting: Vascular Surgery

## 2017-02-10 VITALS — BP 155/65 | HR 92 | Resp 15 | Ht 60.0 in | Wt 122.0 lb

## 2017-02-10 DIAGNOSIS — E119 Type 2 diabetes mellitus without complications: Secondary | ICD-10-CM

## 2017-02-10 DIAGNOSIS — E785 Hyperlipidemia, unspecified: Secondary | ICD-10-CM | POA: Insufficient documentation

## 2017-02-10 DIAGNOSIS — I6521 Occlusion and stenosis of right carotid artery: Secondary | ICD-10-CM

## 2017-02-10 NOTE — Progress Notes (Signed)
Subjective:    Patient ID: Nancy Green, female    DOB: 06-22-1930, 83 y.o.   MRN: [id number] Chief Complaint  Patient presents with  . Carotid    6 month carotid follow up   Patient presents for 6 month follow-up. She is asymptomatic. We do not have a hard copy of her last carotid duplex. She is unsure where she had it done. The patient will look into it and let our office know so we can obtain the initial duplex. Patient underwent a bilateral carotid arterial duplex exam which was notable for 40-59% stenosis of the right internal carotid artery, greater than 50% stenosis of the right subclavian artery and left subclavian artery, 4059% stenosis of the left internal carotid artery and 60-79% of the right common carotid artery bulb. The patient denies experiencing Amaurosis Fugax, TIA like symptoms or focal motor deficits.    Review of Systems  Constitutional: Negative.   HENT: Negative.   Eyes: Negative.   Respiratory: Negative.   Cardiovascular: Negative.   Gastrointestinal: Negative.   Endocrine: Negative.   Genitourinary: Negative.   Musculoskeletal: Negative.   Skin: Negative.   Allergic/Immunologic: Negative.   Neurological: Negative.   Hematological: Negative.   Psychiatric/Behavioral: Negative.       Objective:   Physical Exam  Constitutional: She is oriented to person, place, and time. She appears well-developed and well-nourished. No distress.  HENT:  Head: Normocephalic and atraumatic.  Eyes: Conjunctivae are normal. Pupils are equal, round, and reactive to light.  Neck: Normal range of motion.  Mild bilateral bruit noted  Cardiovascular: Normal rate, regular rhythm and normal heart sounds.   Pulses:      Radial pulses are 2+ on the right side, and 2+ on the left side.  Pulmonary/Chest: Effort normal and breath sounds normal.  Musculoskeletal: Normal range of motion. She exhibits no edema.  Neurological: She is alert and oriented to person, place, and  time.  Skin: Skin is warm and dry. She is not diaphoretic.  Psychiatric: She has a normal mood and affect. Her behavior is normal. Judgment and thought content normal.  Vitals reviewed.  BP (!) 155/65 (BP Location: Right Arm)   Pulse 92   Resp 15   Ht 5' (1.524 m)   Wt 122 lb (55.3 kg)   BMI 23.83 kg/m   Past Medical History:  Diagnosis Date  . Aneurysm (HCC)    removed yrs ago - craniotomy  . Arthritis    fingers  . Diabetes mellitus without complication (HCC)   . GERD (gastroesophageal reflux disease)   . Gout   . Heart murmur    followed by PCP  . History of hiatal hernia    "repaired"  . Hypertension   . Legal blindness of left eye, as defined in [place]     Social History   Social History  . Marital status: Widowed    Spouse name: N/A  . Number of children: N/A  . Years of education: N/A   Occupational History  . Not on file.   Social History Main Topics  . Smoking status: Former Games developermoker  . Smokeless tobacco: Never Used     Comment: quit 20+ yrs ago  . Alcohol use No  . Drug use: Unknown  . Sexual activity: Not on file   Other Topics Concern  . Not on file   Social History Narrative  . No narrative on file    Past Surgical History:  Procedure Laterality Date  .  ABDOMINAL HYSTERECTOMY    . APPENDECTOMY    . CATARACT EXTRACTION W/PHACO Left 09/17/2015   Procedure: CATARACT EXTRACTION PHACO AND INTRAOCULAR LENS PLACEMENT (IOC);  Surgeon: Lockie Mola, MD;  Location: [facility];  Service: Ophthalmology;  Laterality: Left;  DIABETIC - insulin and oral meds  . CATARACT EXTRACTION W/PHACO Right 10/22/2015   Procedure: CATARACT EXTRACTION PHACO AND INTRAOCULAR LENS PLACEMENT (IOC);  Surgeon: Lockie Mola, MD;  [facility]: [facility];  Service: Ophthalmology;  Laterality: Right;  DIABETIC - insulin and oral meds  . CHOLECYSTECTOMY    . COLONOSCOPY    . CRANIOTOMY  1967  . hiatel hernia     . RECTAL SURGERY      No family  history on file.  Allergies  Allergen Reactions  . Neomycin Rash  . Zantac [Ranitidine Hcl] Hives       . Penicillin G Rash       Assessment & Plan:  Patient presents for 6 month follow-up. She is asymptomatic. We do not have a hard copy of her last carotid duplex. She is unsure where she had it done. The patient will look into it and let our office know so we can obtain the initial duplex. Patient underwent a bilateral carotid arterial duplex exam which was notable for 40-59% stenosis of the right internal carotid artery, greater than 50% stenosis of the right subclavian artery and left subclavian artery, 4059% stenosis of the left internal carotid artery and 60-79% of the right common carotid artery bulb. The patient denies experiencing Amaurosis Fugax, TIA like symptoms or focal motor deficits.   1. Stenosis of Bilateral carotid artery - stable Studies reviewed with patient. Patient asymptomatic with noncritical duplex.  No intervention at this time.  Patient to return in six months for surveillance carotid duplex. I have discussed with the patient at length the risk factors for and pathogenesis of atherosclerotic disease and encouraged a healthy diet, regular exercise regimen and blood pressure / glucose control.  Patient was instructed to contact our office in the interim with problems such as arm / leg weakness or numbness, speech / swallowing difficulty or temporary monocular blindness. The patient expresses their understanding.   - VAS US CAROTID; Future  2. Type 2 diabetes mellitus without complication, unspecified whether long term insulin use (HCC) - stable Encouraged good control as its slows the progression of atherosclerotic disease  3. Hyperlipidemia, unspecified hyperlipidemia type - stable Encouraged good control as its slows the progression of atherosclerotic disease  Current Outpatient Prescriptions on File Prior to Visit  Medication Sig Dispense Refill  . albuterol  (PROVENTIL HFA;VENTOLIN HFA) 108 (90 BASE) MCG/ACT inhaler Inhale 2 puffs into the lungs every 4 (four) hours as needed for wheezing or shortness of breath. 1 Inhaler 0  . allopurinol (ZYLOPRIM) 100 MG tablet Take 100 mg by mouth daily.    . clopidogrel (PLAVIX) 75 MG tablet Take 75 mg by mouth daily.    . diazepam (VALIUM) 5 MG tablet Take 5 mg by mouth as needed for anxiety.    . insulin aspart (NOVOLOG FLEXPEN) 100 UNIT/ML FlexPen Inject into the skin 3 (three) times daily with meals.    Marland Kitchen loratadine (CLARITIN) 10 MG tablet Take 10 mg by mouth daily as needed for allergies.    Marland Kitchen losartan (COZAAR) 100 MG tablet Take 100 mg by mouth daily.    . metFORMIN (GLUCOPHAGE) 500 MG tablet Take by mouth at bedtime.    Marland Kitchen NIFEdipine (PROCARDIA XL/ADALAT-CC) 60 MG 24  hr tablet Take 60 mg by mouth daily.    Marland Kitchen omeprazole (PRILOSEC) 40 MG capsule Take 40 mg by mouth daily.    . predniSONE (DELTASONE) 10 MG tablet Take 12.5 mg by mouth daily with breakfast.     . triamterene-hydrochlorothiazide (DYAZIDE) 37.5-25 MG per capsule Take 1 capsule by mouth daily.      No current facility-administered medications on file prior to visit.     There are no Patient Instructions on file for this visit. No Follow-up on file.   Pretty Weltman A Perry Molla, PA-C

## 2017-02-12 LAB — VAS US CAROTID
LEFT ECA DIAS: 0 cm/s
LEFT VERTEBRAL DIAS: 26 cm/s
LICAPDIAS: -28 cm/s
Left CCA dist dias: -30 cm/s
Left CCA dist sys: -122 cm/s
Left CCA prox dias: 17 cm/s
Left CCA prox sys: 88 cm/s
Left ICA dist dias: -22 cm/s
Left ICA dist sys: -100 cm/s
Left ICA prox sys: -143 cm/s
RIGHT CCA MID DIAS: -20 cm/s
RIGHT ECA DIAS: 0 cm/s
RIGHT VERTEBRAL DIAS: 11 cm/s
Right CCA prox dias: 18 cm/s
Right CCA prox sys: 107 cm/s
Right cca dist sys: -70 cm/s

## 2017-02-16 ENCOUNTER — Ambulatory Visit
Admission: RE | Admit: 2017-02-16 | Discharge: 2017-02-16 | Disposition: A | Discharge status: Home / Self Care | Payer: Medicare Other | Source: Ambulatory Visit | Attending: Rheumatology | Admitting: Rheumatology

## 2017-02-16 DIAGNOSIS — M79604 Pain in right leg: Secondary | ICD-10-CM | POA: Diagnosis present

## 2017-02-16 DIAGNOSIS — M810 Age-related osteoporosis without current pathological fracture: Secondary | ICD-10-CM | POA: Insufficient documentation

## 2017-02-16 DIAGNOSIS — M79605 Pain in left leg: Secondary | ICD-10-CM | POA: Insufficient documentation

## 2017-02-16 DIAGNOSIS — M159 Polyosteoarthritis, unspecified: Secondary | ICD-10-CM

## 2017-02-16 DIAGNOSIS — M316 Other giant cell arteritis: Secondary | ICD-10-CM | POA: Diagnosis not present

## 2017-02-16 DIAGNOSIS — M150 Primary generalized (osteo)arthritis: Secondary | ICD-10-CM | POA: Insufficient documentation

## 2017-02-17 DIAGNOSIS — I739 Peripheral vascular disease, unspecified: Secondary | ICD-10-CM

## 2017-02-17 HISTORY — DX: Peripheral vascular disease, unspecified: I73.9

## 2017-02-24 ENCOUNTER — Other Ambulatory Visit (INDEPENDENT_AMBULATORY_CARE_PROVIDER_SITE_OTHER): Payer: Self-pay | Admitting: Vascular Surgery

## 2017-02-24 DIAGNOSIS — R0989 Other specified symptoms and signs involving the circulatory and respiratory systems: Secondary | ICD-10-CM

## 2017-02-25 ENCOUNTER — Ambulatory Visit (INDEPENDENT_AMBULATORY_CARE_PROVIDER_SITE_OTHER): Payer: Medicare Other

## 2017-02-25 ENCOUNTER — Encounter (INDEPENDENT_AMBULATORY_CARE_PROVIDER_SITE_OTHER): Payer: Self-pay | Admitting: Vascular Surgery

## 2017-02-25 ENCOUNTER — Ambulatory Visit (INDEPENDENT_AMBULATORY_CARE_PROVIDER_SITE_OTHER): Payer: Medicare Other | Admitting: Vascular Surgery

## 2017-02-25 VITALS — BP 149/60 | HR 93 | Resp 16 | Wt 120.0 lb

## 2017-02-25 DIAGNOSIS — I739 Peripheral vascular disease, unspecified: Secondary | ICD-10-CM

## 2017-02-25 DIAGNOSIS — M79605 Pain in left leg: Secondary | ICD-10-CM

## 2017-02-25 DIAGNOSIS — E119 Type 2 diabetes mellitus without complications: Secondary | ICD-10-CM

## 2017-02-25 DIAGNOSIS — M79606 Pain in leg, unspecified: Secondary | ICD-10-CM | POA: Insufficient documentation

## 2017-02-25 DIAGNOSIS — R0989 Other specified symptoms and signs involving the circulatory and respiratory systems: Secondary | ICD-10-CM

## 2017-02-25 DIAGNOSIS — M79604 Pain in right leg: Secondary | ICD-10-CM

## 2017-02-25 DIAGNOSIS — E785 Hyperlipidemia, unspecified: Secondary | ICD-10-CM

## 2017-02-25 DIAGNOSIS — I6521 Occlusion and stenosis of right carotid artery: Secondary | ICD-10-CM

## 2017-02-25 NOTE — Assessment & Plan Note (Signed)
Noninvasive studies today demonstrate a right ABI of 0.84 and a left ABI of 0.94. Her waveforms are brisk and triphasic in both lower extremities all the way down to the digits. She has some mild peripheral arterial disease this is not severe and is unlikely to be the cause of her lower extremity symptoms. We can plan on checking this again in a year. Continue current medical regimen including Plavix and statin agent.

## 2017-02-25 NOTE — Assessment & Plan Note (Signed)
lipid control important in reducing the progression of atherosclerotic disease. Continue statin therapy  

## 2017-02-25 NOTE — Progress Notes (Signed)
MRN : [id number]  Yukie Bergeron is a 83 y.o. (April 17, 1930) female who presents with chief complaint of  Chief Complaint  Patient presents with  . Follow-up  .  History of Present Illness: Patient returns today in follow up Today at the request of her primary care physician to evaluate her lower extremity perfusion. She's been complaining of a lot of leg pain with activity. Her legs are tired and her ambulatory distance is have declined. She does not have ulceration or infection. Noninvasive studies today demonstrate a right ABI of 0.84 and a left ABI of 0.94. Her waveforms are brisk and triphasic in both lower extremities all the way down to the digits      Past Medical History:  Diagnosis Date  . Aneurysm (HCC)    removed yrs ago - craniotomy  . Arthritis    fingers  . Diabetes mellitus without complication (HCC)   . GERD (gastroesophageal reflux disease)   . Gout   . Heart murmur    followed by PCP  . History of hiatal hernia    "repaired"  . Hypertension   . Legal blindness of left eye, as defined in [place]          Past Surgical History:  Procedure Laterality Date  . ABDOMINAL HYSTERECTOMY    . APPENDECTOMY    . CATARACT EXTRACTION W/PHACO Left 09/17/2015   Procedure: CATARACT EXTRACTION PHACO AND INTRAOCULAR LENS PLACEMENT (IOC);  Surgeon: Lockie Mola, MD;  Location: [facility];  Service: Ophthalmology;  Laterality: Left;  DIABETIC - insulin and oral meds  . CATARACT EXTRACTION W/PHACO Right 10/22/2015   Procedure: CATARACT EXTRACTION PHACO AND INTRAOCULAR LENS PLACEMENT (IOC);  Surgeon: Lockie Mola, MD;  Location: [facility];  Service: Ophthalmology;  Laterality: Right;  DIABETIC - insulin and oral meds  . CHOLECYSTECTOMY    . COLONOSCOPY    . CRANIOTOMY  1967  . hiatel hernia     . RECTAL SURGERY      Family history No bleeding disorders, clotting disorders, autoimmune diseases, or  aneurysms  Social History       Social History  Substance Use Topics  . Smoking status: Former Games developer  . Smokeless tobacco: Not on file     Comment: quit 20+ yrs ago  . Alcohol use No  No IV drug use       Allergies  Allergen Reactions  . Neomycin Rash  . Zantac [Ranitidine Hcl] Hives       . Penicillin G Rash          Current Outpatient Prescriptions  Medication Sig Dispense Refill  . albuterol (PROVENTIL HFA;VENTOLIN HFA) 108 (90 BASE) MCG/ACT inhaler Inhale 2 puffs into the lungs every 4 (four) hours as needed for wheezing or shortness of breath. 1 Inhaler 0  . allopurinol (ZYLOPRIM) 100 MG tablet Take 100 mg by mouth daily.    . clopidogrel (PLAVIX) 75 MG tablet Take 75 mg by mouth daily.    . diazepam (VALIUM) 5 MG tablet Take 5 mg by mouth as needed for anxiety.    . insulin aspart (NOVOLOG FLEXPEN) 100 UNIT/ML FlexPen Inject into the skin 3 (three) times daily with meals.    Marland Kitchen loratadine (CLARITIN) 10 MG tablet Take 10 mg by mouth daily as needed for allergies.    Marland Kitchen losartan (COZAAR) 100 MG tablet Take 100 mg by mouth daily.    . metFORMIN (GLUCOPHAGE) 500 MG tablet Take by mouth at bedtime.    Marland Kitchen  NIFEdipine (PROCARDIA XL/ADALAT-CC) 60 MG 24 hr tablet Take 60 mg by mouth daily.    Marland Kitchen NIFEdipine (PROCARDIA-XL/ADALAT CC) 60 MG 24 hr tablet Take 60 mg by mouth daily. Reported on 10/14/2015    . omeprazole (PRILOSEC) 40 MG capsule Take 40 mg by mouth daily.    . predniSONE (DELTASONE) 10 MG tablet Take 12.5 mg by mouth daily with breakfast.     . triamterene-hydrochlorothiazide (DYAZIDE) 37.5-25 MG per capsule Take 1 capsule by mouth daily.      No current facility-administered medications for this visit.       REVIEW OF SYSTEMS (Negative unless checked)  Constitutional: [] Weight loss  [] Fever  [] Chills Cardiac: [] Chest pain   [] Chest pressure   [] Palpitations   [] Shortness of breath when laying flat   [] Shortness of breath at rest    [] Shortness of breath with exertion. Vascular:  [] Pain in legs with walking   [] Pain in legs at rest   [] Pain in legs when laying flat   [] Claudication   [] Pain in feet when walking  [] Pain in feet at rest  [] Pain in feet when laying flat   [] History of DVT   [] Phlebitis   [] Swelling in legs   [] Varicose veins   [] Non-healing ulcers Pulmonary:   [] Uses home oxygen   [] Productive cough   [] Hemoptysis   [] Wheeze  [] COPD   [] Asthma Neurologic:  [x] Dizziness  [] Blackouts   [] Seizures   [] History of stroke   [] History of TIA  [] Aphasia   [] Temporary blindness   [] Dysphagia   [] Weakness or numbness in arms   [] Weakness or numbness in legs Musculoskeletal:  [x] Arthritis   [] Joint swelling   [] Joint pain   [] Low back pain Hematologic:  [] Easy bruising  [] Easy bleeding   [] Hypercoagulable state   [] Anemic  [] Hepatitis Gastrointestinal:  [] Blood in stool   [] Vomiting blood  [] Gastroesophageal reflux/heartburn   [] Abdominal pain Genitourinary:  [] Chronic kidney disease   [] Difficult urination  [] Frequent urination  [] Burning with urination   [] Hematuria Skin:  [] Rashes   [] Ulcers   [] Wounds Psychological:  [] History of anxiety   []  History of major depression.    Physical Examination  BP (!) 149/60   Pulse 93   Resp 16   Wt 120 lb (54.4 kg)   BMI 23.44 kg/m  Gen:  WD/WN, NAD. Appears younger than stated age Head: [place]/AT, No temporalis wasting. Ear/Nose/Throat: Hearing grossly intact, nares w/o erythema or drainage, trachea midline Eyes: Conjunctiva clear. Sclera non-icteric Neck: Supple.  No JVD.  Pulmonary:  Good air movement, no use of accessory muscles.  Cardiac: RRR, normal S1, S2 Vascular:  Vessel Right Left  Radial Palpable Palpable                      Popliteal 1+ Palpable 1+ Palpable  PT 1+ Palpable Palpable  DP 1+ Palpable 1+ Palpable    Musculoskeletal: M/S 5/5 throughout.  No deformity or atrophy.  Neurologic: Sensation grossly intact in extremities.  Symmetrical.  Speech  is fluent.  Psychiatric: Judgment intact, Mood & affect appropriate for pt's clinical situation. Dermatologic: No rashes or ulcers noted.  No cellulitis or open wounds.       Labs Recent Results (from the past 2160 hour(s))  VAS US CAROTID     Status: None   Collection Time: 02/10/17  3:43 PM  Result Value Ref Range   Right CCA prox sys 107 cm/s   Right CCA prox dias 18 cm/s   Right cca  dist sys -70 cm/s   Left CCA prox sys 88 cm/s   Left CCA prox dias 17 cm/s   Left CCA dist sys -122 cm/s   Left CCA dist dias -30 cm/s   Left ICA prox sys -143 cm/s   Left ICA prox dias -28 cm/s   Left ICA dist sys -100 cm/s   Left ICA dist dias -22 cm/s   RIGHT CCA MID DIAS -20.00 cm/s   RIGHT ECA DIAS 0.00 cm/s   RIGHT VERTEBRAL DIAS 11.00 cm/s   LEFT ECA DIAS 0.00 cm/s   LEFT VERTEBRAL DIAS 26.00 cm/s    Radiology Dg Bone Density (dxa)  Result Date: 02/16/2017 EXAM: DUAL X-RAY ABSORPTIOMETRY (DXA) FOR BONE MINERAL DENSITY IMPRESSION: Dear Dr Cecille AmsterdamGeorge Kernodle, Your patient Wendelyn BreslowFrances Mehringer completed a BMD test on 02/16/2017 using the Lunar Prodigy Advance DXA System (analysis version: 14.10) manufactured by [employer]. The following summarizes the results of our evaluation. PATIENT BIOGRAPHICAL: Name: Higinio PlanLedford, Marlea W Patient ID: [id number] Birth Date: [birth year] Height: 60.0 in. Gender: Female Exam Date: 02/16/2017 Weight: 122.0 lbs. Indications: Advanced Age, arthritis, Caucasian, diabetic, Family History of Fracture, Hysterectomy, Low Body Weight, POSTmenopausal, skin ca Fractures: Treatments: insulin, metformin, plavix, prednisone, Vitamin D ASSESSMENT: The BMD measured at Femur Neck Left is 0.667 g/cm2 with a T-score of -2.7. This patient is considered osteoporotic according to World Health Organization [facility](WHO) criteria. Patient is not a candidate for FRAX assessment due to osteoporosis report. Site Region Measured Measured WHO Young Adult BMD Date       Age      Classification T-score  DualFemur Neck Left 02/16/2017 85.0 Osteoporosis -2.7 0.667 g/cm2 AP Spine L1-L4 02/16/2017 85.0 Osteopenia -1.5 1.000 g/cm2 World Health Organization [facility](WHO) criteria for post-menopausal, Caucasian Women: Normal:       T-score at or above -1 SD Osteopenia:   T-score between -1 and -2.5 SD Osteoporosis: T-score at or below -2.5 SD RECOMMENDATIONS: National Osteoporosis Foundation recommends that FDA-approved medical therapies be considered in postemenopausal women and men age 81 or older with a: 1. Hip or vertebral (clinical or morphometric) fracture. 2. T-score of < -2.5at the spine or hip. 3. Ten-year fracture probability by FRAX of 3% or greater for hip fracture or 20% or greater for major osteoporotic fracture. All treatment decisions require clinical judgment and consideration of individual patient factors, including patient preferences, co-morbidities, previous drug use, risk factors not captured in the FRAX model (e.g. falls, vitamin D deficiency, increased bone turnover, interval significant decline in bone density) and possible under - or over-estimation of fracture risk by FRAX. All patients should ensure an adequate intake of dietary calcium (1200 mg/d) and vitamin D (800 IU daily) unless contraindicated. FOLLOW-UP: People with diagnosed cases of osteoporosis or at high risk for fracture should have regular bone mineral density tests. For patients eligible for Medicare, routine testing is allowed once every 2 years. The testing frequency can be increased to one year for patients who have rapidly progressing disease, those who are receiving or discontinuing medical therapy to restore bone mass, or have additional risk factors. I have reviewed this report, and agree with the above findings. [facility] Electronically Signed   By: Lupita RaiderJames  Green Jr, M.D.   On: 02/16/2017 08:31     Assessment/Plan Diabetes (HCC) blood glucose control important in reducing the progression of atherosclerotic disease.  Also, involved in wound healing. On appropriate medications.   Dizziness and giddiness Unlikely due to carotid disease.    Carotid stenosis Recently checked  and stable. I discussed the general recommendation for carotid stenosis in the moderate range to be treated with antiplatelet therapy and continued surveillance. She will return in 6 months with a carotid duplex for further evaluation. We discussed the addition of an 81 mg aspirin to her 75 mg Plavix. We also discussed the recent prescription for a statin agent she received, and discussed that beginning this may be of benefit as well although I will defer to her primary care physician. We discussed signs and symptoms of stroke and TIA. I will see the patient back in 6 months with her duplex.    Hyperlipidemia lipid control important in reducing the progression of atherosclerotic disease. Continue statin therapy   Leg pain Given the findings of her arterial studies, more than likely neurogenic in nature.  PAD (peripheral artery disease) (HCC) Noninvasive studies today demonstrate a right ABI of 0.84 and a left ABI of 0.94. Her waveforms are brisk and triphasic in both lower extremities all the way down to the digits. She has some mild peripheral arterial disease this is not severe and is unlikely to be the cause of her lower extremity symptoms. We can plan on checking this again in a year. Continue current medical regimen including Plavix and statin agent.    Festus Barren, MD  02/25/2017 4:12 PM    This note was created with Dragon medical transcription system.  Any errors from dictation are purely unintentional

## 2017-02-25 NOTE — Assessment & Plan Note (Signed)
Given the findings of her arterial studies, more than likely neurogenic in nature.

## 2017-08-15 ENCOUNTER — Ambulatory Visit (INDEPENDENT_AMBULATORY_CARE_PROVIDER_SITE_OTHER): Payer: Medicare Other

## 2017-08-15 ENCOUNTER — Encounter (INDEPENDENT_AMBULATORY_CARE_PROVIDER_SITE_OTHER): Payer: Self-pay | Admitting: Vascular Surgery

## 2017-08-15 ENCOUNTER — Ambulatory Visit (INDEPENDENT_AMBULATORY_CARE_PROVIDER_SITE_OTHER): Payer: Medicare Other | Admitting: Vascular Surgery

## 2017-08-15 VITALS — BP 145/63 | HR 100 | Resp 14 | Ht 59.0 in | Wt 114.0 lb

## 2017-08-15 DIAGNOSIS — M79605 Pain in left leg: Secondary | ICD-10-CM | POA: Diagnosis not present

## 2017-08-15 DIAGNOSIS — E119 Type 2 diabetes mellitus without complications: Secondary | ICD-10-CM

## 2017-08-15 DIAGNOSIS — I6521 Occlusion and stenosis of right carotid artery: Secondary | ICD-10-CM

## 2017-08-15 DIAGNOSIS — I6523 Occlusion and stenosis of bilateral carotid arteries: Secondary | ICD-10-CM | POA: Diagnosis not present

## 2017-08-15 DIAGNOSIS — M79604 Pain in right leg: Secondary | ICD-10-CM

## 2017-08-15 DIAGNOSIS — I739 Peripheral vascular disease, unspecified: Secondary | ICD-10-CM | POA: Diagnosis not present

## 2017-08-15 DIAGNOSIS — E785 Hyperlipidemia, unspecified: Secondary | ICD-10-CM

## 2017-08-15 NOTE — Progress Notes (Signed)
MRN : [id number]  Nancy Green is a 83 y.o. (1929/10/14) female who presents with chief complaint of  Chief Complaint  Patient presents with  . Carotid    6 month  .  History of Present Illness: The patient is seen for follow up evaluation of carotid stenosis. The carotid stenosis followed by ultrasound.   The patient denies amaurosis fugax. There is no recent history of TIA symptoms or focal motor deficits. There is no prior documented CVA.  The patient is taking enteric-coated aspirin 81 mg daily.  There is no history of migraine headaches. There is no history of seizures.  The patient has a history of coronary artery disease, no recent episodes of angina or shortness of breath. The patient denies PAD or claudication symptoms. There is a history of hyperlipidemia which is being treated with a statin.      Current Meds  Medication Sig  . albuterol (PROVENTIL HFA;VENTOLIN HFA) 108 (90 BASE) MCG/ACT inhaler Inhale 2 puffs into the lungs every 4 (four) hours as needed for wheezing or shortness of breath.  . allopurinol (ZYLOPRIM) 100 MG tablet Take 100 mg by mouth daily.  . clopidogrel (PLAVIX) 75 MG tablet Take 75 mg by mouth daily.  . diazepam (VALIUM) 5 MG tablet Take 5 mg by mouth as needed for anxiety.  . insulin aspart (NOVOLOG FLEXPEN) 100 UNIT/ML FlexPen Inject into the skin 3 (three) times daily with meals.  Marland Kitchen. loratadine (CLARITIN) 10 MG tablet Take 10 mg by mouth daily as needed for allergies.  Marland Kitchen. losartan (COZAAR) 100 MG tablet Take 100 mg by mouth daily.  . metFORMIN (GLUCOPHAGE) 500 MG tablet Take by mouth at bedtime.  Marland Kitchen. NIFEdipine (PROCARDIA XL/ADALAT-CC) 60 MG 24 hr tablet Take 60 mg by mouth daily.  Marland Kitchen. omeprazole (PRILOSEC) 40 MG capsule Take 40 mg by mouth daily.  . pravastatin (PRAVACHOL) 10 MG tablet Take by mouth.  . predniSONE (DELTASONE) 10 MG tablet Take 12.5 mg by mouth daily with breakfast.   . triamterene-hydrochlorothiazide (DYAZIDE) 37.5-25  MG per capsule Take 1 capsule by mouth daily.   . Vitamin D, Ergocalciferol, (DRISDOL) 50000 units CAPS capsule TAKE 1 CAPSULE BY MOUTH EVERY 14 DAYS    Past Medical History:  Diagnosis Date  . Aneurysm (HCC)    removed yrs ago - craniotomy  . Arthritis    fingers  . Diabetes mellitus without complication (HCC)   . GERD (gastroesophageal reflux disease)   . Gout   . Heart murmur    followed by PCP  . History of hiatal hernia    "repaired"  . Hypertension   . Legal blindness of left eye, as defined in [place]     Past Surgical History:  Procedure Laterality Date  . ABDOMINAL HYSTERECTOMY    . APPENDECTOMY    . CATARACT EXTRACTION W/PHACO Left 09/17/2015   Procedure: CATARACT EXTRACTION PHACO AND INTRAOCULAR LENS PLACEMENT (IOC);  Surgeon: Lockie Molahadwick Brasington, MD;  [facility];  Service: Ophthalmology;  Laterality: Left;  DIABETIC - insulin and oral meds  . CATARACT EXTRACTION W/PHACO Right 10/22/2015   Procedure: CATARACT EXTRACTION PHACO AND INTRAOCULAR LENS PLACEMENT (IOC);  Surgeon: Lockie Molahadwick Brasington, MD;  [facility];  Service: Ophthalmology;  Laterality: Right;  DIABETIC - insulin and oral meds  . CHOLECYSTECTOMY    . COLONOSCOPY    . CRANIOTOMY  1967  . hiatel hernia     . RECTAL SURGERY      Social History Social History  Tobacco Use  . Smoking status: Former Games developermoker  . Smokeless tobacco: Never Used  . Tobacco comment: quit 20+ yrs ago  Substance Use Topics  . Alcohol use: No  . Drug use: Not on file    Family History History reviewed. No pertinent family history.  Allergies  Allergen Reactions  . Neomycin Rash  . Zantac [Ranitidine Hcl] Hives       . Penicillin G Rash     REVIEW OF SYSTEMS (Negative unless checked)  Constitutional: [] Weight loss  [] Fever  [] Chills Cardiac: [] Chest pain   [] Chest pressure   [] Palpitations   [] Shortness of breath when laying flat   [] Shortness of breath with exertion. Vascular:   [] Pain in legs with walking   [] Pain in legs at rest  [] History of DVT   [] Phlebitis   [] Swelling in legs   [] Varicose veins   [] Non-healing ulcers Pulmonary:   [] Uses home oxygen   [] Productive cough   [] Hemoptysis   [] Wheeze  [] COPD   [] Asthma Neurologic:  [] Dizziness   [] Seizures   [] History of stroke   [] History of TIA  [] Aphasia   [] Vissual changes   [] Weakness or numbness in arm   [] Weakness or numbness in leg Musculoskeletal:   [] Joint swelling   [] Joint pain   [] Low back pain Hematologic:  [] Easy bruising  [] Easy bleeding   [] Hypercoagulable state   [] Anemic Gastrointestinal:  [] Diarrhea   [] Vomiting  [] Gastroesophageal reflux/heartburn   [] Difficulty swallowing. Genitourinary:  [] Chronic kidney disease   [] Difficult urination  [] Frequent urination   [] Blood in urine Skin:  [] Rashes   [] Ulcers  Psychological:  [] History of anxiety   []  History of major depression.  Physical Examination  Vitals:   08/15/17 1146 08/15/17 1147  BP: (!) 146/54 (!) 145/63  Pulse: (!) 104 100  Resp: 14   Weight: 114 lb (51.7 kg)   Height: 4\' 11"  (1.499 m)    Body mass index is 23.03 kg/m. Gen: WD/WN, NAD Head: [place]/AT, No temporalis wasting.  Ear/Nose/Throat: Hearing grossly intact, nares w/o erythema or drainage Eyes: PER, EOMI, sclera nonicteric.  Neck: Supple, no large masses.   Pulmonary:  Good air movement, no audible wheezing bilaterally, no use of accessory muscles.  Cardiac: RRR, no JVD Vascular: carotid bruit Vessel Right Left  Radial Palpable Palpable  Ulnar Palpable Palpable  Brachial Palpable Palpable  Carotid Palpable Palpable  Gastrointestinal: Non-distended. No guarding/no peritoneal signs.  Musculoskeletal: M/S 5/5 throughout.  No deformity or atrophy.  Neurologic: CN 2-12 intact. Symmetrical.  Speech is fluent. Motor exam as listed above. Psychiatric: Judgment intact, Mood & affect appropriate for pt's clinical situation. Dermatologic: No rashes or ulcers noted.  No changes  consistent with cellulitis. Lymph : No lichenification or skin changes of chronic lymphedema.  CBC Lab Results  Component Value Date   WBC 14.4 (H) 05/28/2015   HGB 14.7 05/28/2015   HCT 45.2 05/28/2015   MCV 88.4 05/28/2015   PLT 330 05/28/2015    BMET    Component Value Date/Time   NA 144 05/28/2015 1539   NA 135 (L) 11/27/2013 0445   K 3.6 05/28/2015 1539   K 3.4 (L) 11/27/2013 0445   CL 107 05/28/2015 1539   CL 102 11/27/2013 0445   CO2 24 05/28/2015 1539   CO2 29 11/27/2013 0445   GLUCOSE 315 (H) 05/28/2015 1539   GLUCOSE 168 (H) 11/27/2013 0445   BUN 33 (H) 05/28/2015 1539   BUN 13 11/27/2013 0445   CREATININE 1.10 (H) 05/28/2015 1539  CREATININE 0.81 11/27/2013 0445   CALCIUM 9.4 05/28/2015 1539   CALCIUM 8.8 11/27/2013 0445   GFRNONAA 45 (L) 05/28/2015 1539   GFRNONAA >60 11/27/2013 0445   GFRAA 52 (L) 05/28/2015 1539   GFRAA >60 11/27/2013 0445   CrCl cannot be calculated (Patient's most recent lab result is older than the maximum 21 days allowed.).  COAG No results found for: INR, PROTIME  Radiology No results found.  Assessment/Plan 1. Bilateral carotid artery stenosis Recommend:  Given the patient's asymptomatic subcritical stenosis no further invasive testing or surgery at this time.  Duplex ultrasound shows <50% stenosis bilaterally.  Continue antiplatelet therapy as prescribed Continue management of CAD, HTN and Hyperlipidemia Healthy heart diet,  encouraged exercise at least 4 times per week  - VAS US CAROTID; Future  2. Type 2 diabetes mellitus without complication, unspecified whether long term insulin use (HCC) Continue hypoglycemic medications as already ordered, these medications have been reviewed and there are no changes at this time.  Hgb A1C to be monitored as already arranged by primary service   3. Hyperlipidemia, unspecified hyperlipidemia type Continue statin as ordered and reviewed, no changes at this  time     Levora Dredge, MD  08/15/2017 1:15 PM

## 2017-08-20 ENCOUNTER — Encounter (INDEPENDENT_AMBULATORY_CARE_PROVIDER_SITE_OTHER): Payer: Self-pay | Admitting: Vascular Surgery

## 2017-11-02 ENCOUNTER — Telehealth: Payer: Self-pay | Admitting: Gastroenterology

## 2017-11-02 ENCOUNTER — Encounter: Payer: Self-pay | Admitting: Gastroenterology

## 2017-11-02 NOTE — Telephone Encounter (Signed)
Pt left vm to schedule apt with Dr. Servando SnareWohl.  I returned her call letting her know we need referral from Primary care to get her scheduled since she has not seen us in 4 years. She states she will do that

## 2017-12-15 ENCOUNTER — Ambulatory Visit (INDEPENDENT_AMBULATORY_CARE_PROVIDER_SITE_OTHER): Payer: Medicare Other | Admitting: Gastroenterology

## 2017-12-15 ENCOUNTER — Encounter (INDEPENDENT_AMBULATORY_CARE_PROVIDER_SITE_OTHER): Payer: Self-pay

## 2017-12-15 ENCOUNTER — Other Ambulatory Visit: Payer: Self-pay

## 2017-12-15 ENCOUNTER — Encounter: Payer: Self-pay | Admitting: Gastroenterology

## 2017-12-15 VITALS — BP 159/62 | HR 95 | Ht 60.0 in | Wt 112.0 lb

## 2017-12-15 DIAGNOSIS — F458 Other somatoform disorders: Secondary | ICD-10-CM | POA: Diagnosis not present

## 2017-12-15 DIAGNOSIS — R0989 Other specified symptoms and signs involving the circulatory and respiratory systems: Secondary | ICD-10-CM

## 2017-12-15 DIAGNOSIS — R1310 Dysphagia, unspecified: Secondary | ICD-10-CM | POA: Diagnosis not present

## 2017-12-15 DIAGNOSIS — R1319 Other dysphagia: Secondary | ICD-10-CM

## 2017-12-15 NOTE — Patient Instructions (Signed)
[facility] are [facility] on Wednesday, April 24th @ 9:00am. Please check in at the medical mall registration desk at 8:45am. You cannot have anything to eat or drink after midnight.   If you need to reschedule this appointment for any reason, please contact central scheduling at [phone].

## 2017-12-15 NOTE — Progress Notes (Signed)
Gastroenterology Consultation  Referring Provider:     Myrene BuddyGauger, Sarah Kathryn, * Primary Care Physician:  Myrene BuddyGauger, Sarah Kathryn, NP Primary Gastroenterologist:  Dr. Servando SnareWohl     Reason for Consultation:     Globus        HPI:   Nancy Green is a 84 y.o. y/o female referred for consultation & management of globus by Dr. Bayard MalesGauger, Hermenia FiscalSarah Kathryn, NP.  This patient comes to see me for a fullness feeling.  The patient had seen me back in 2015 and has not seen me since. The patient reports that she has been having the fullness felt mostly In the epigastric area.  The patient also reports that it happens with things like rice and pills and not only with solids more than liquids.  Past Medical History:  Diagnosis Date  . Aneurysm (HCC)    removed yrs ago - craniotomy  . Arthritis    fingers  . Diabetes mellitus without complication (HCC)   . GERD (gastroesophageal reflux disease)   . Gout   . Heart murmur    followed by PCP  . History of hiatal hernia    "repaired"  . Hypertension   . Legal blindness of left eye, as defined in [place]     Past Surgical History:  Procedure Laterality Date  . ABDOMINAL HYSTERECTOMY    . APPENDECTOMY    . CATARACT EXTRACTION W/PHACO Left 09/17/2015   Procedure: CATARACT EXTRACTION PHACO AND INTRAOCULAR LENS PLACEMENT (IOC);  Surgeon: Lockie Molahadwick Brasington, MD;  [facility];  Service: Ophthalmology;  Laterality: Left;  DIABETIC - insulin and oral meds  . CATARACT EXTRACTION W/PHACO Right 10/22/2015   Procedure: CATARACT EXTRACTION PHACO AND INTRAOCULAR LENS PLACEMENT (IOC);  Surgeon: Lockie Molahadwick Brasington, MD;  [facility];  Service: Ophthalmology;  Laterality: Right;  DIABETIC - insulin and oral meds  . CHOLECYSTECTOMY    . COLONOSCOPY    . CRANIOTOMY  1967  . hiatel hernia     . RECTAL SURGERY      Prior to Admission medications   Medication Sig Start Date End Date Taking? Authorizing Provider  albuterol  (PROVENTIL HFA;VENTOLIN HFA) 108 (90 BASE) MCG/ACT inhaler Inhale 2 puffs into the lungs every 4 (four) hours as needed for wheezing or shortness of breath. 05/28/15   Cuthriell, Delorise RoyalsJonathan D, PA-C  allopurinol (ZYLOPRIM) 100 MG tablet Take 100 mg by mouth daily.    [provider]  clopidogrel (PLAVIX) 75 MG tablet Take 75 mg by mouth daily.    [provider]  diazepam (VALIUM) 5 MG tablet Take 5 mg by mouth as needed for anxiety.    [provider]  insulin aspart (NOVOLOG FLEXPEN) 100 UNIT/ML FlexPen Inject into the skin 3 (three) times daily with meals.    [provider]  loratadine (CLARITIN) 10 MG tablet Take 10 mg by mouth daily as needed for allergies.    [provider]  losartan (COZAAR) 100 MG tablet Take 100 mg by mouth daily.    [provider]  metFORMIN (GLUCOPHAGE) 500 MG tablet Take by mouth at bedtime.    [provider]  metFORMIN (GLUCOPHAGE-XR) 500 MG 24 hr tablet  11/21/17   [provider]  NIFEdipine (PROCARDIA XL/ADALAT-CC) 60 MG 24 hr tablet Take 60 mg by mouth daily.    [provider]  omeprazole (PRILOSEC) 40 MG capsule Take 40 mg by mouth daily.    [provider]  pravastatin (PRAVACHOL) 10 MG tablet  Take by mouth. 12/21/16 12/21/17  [provider]  predniSONE (DELTASONE) 10 MG tablet Take 12.5 mg by mouth daily with breakfast.     [provider]  triamterene-hydrochlorothiazide (DYAZIDE) 37.5-25 MG per capsule Take 1 capsule by mouth daily.     [provider]  Vitamin D, Ergocalciferol, (DRISDOL) 50000 units CAPS capsule TAKE 1 CAPSULE BY MOUTH EVERY 14 DAYS 03/30/17   [provider]    No family history on file.   Social History   Tobacco Use  . Smoking status: Former Games developer  . Smokeless tobacco: Never Used  . Tobacco comment: quit 20+ yrs ago  Substance Use Topics  . Alcohol use: No  . Drug use: Not on file    Allergies as of  12/15/2017 - Review Complete 08/20/2017  Allergen Reaction Noted  . Neomycin Rash 07/26/2011  . Zantac [ranitidine hcl] Hives 12/24/2014  . Penicillin g Rash 12/24/2014    Review of Systems:    All systems reviewed and negative except where noted in HPI.   Physical Exam:  There were no vitals taken for this visit. No LMP recorded. Patient has had a hysterectomy. Psych:  Alert and cooperative. Normal mood and affect. General:   Alert,  Well-developed, well-nourished, pleasant and cooperative in NAD Head:  Normocephalic and atraumatic. Eyes:  Sclera clear, no icterus.   Conjunctiva pink. Ears:  Normal auditory acuity. Nose:  No deformity, discharge, or lesions. Mouth:  No deformity or lesions,oropharynx pink & moist. Neck:  Supple; no masses or thyromegaly. Lungs:  Respirations even and unlabored.  Clear throughout to auscultation.   No wheezes, crackles, or rhonchi. No acute distress. Heart:  Regular rate and rhythm; no murmurs, clicks, rubs, or gallops. Abdomen:  Normal bowel sounds.  No bruits.  Soft, non-tender and non-distended without masses, hepatosplenomegaly or hernias noted.  No guarding or rebound tenderness.  Negative Carnett sign.   Rectal:  Deferred.  Msk:  Symmetrical without gross deformities.  Good, equal movement & strength bilaterally. Pulses:  Normal pulses noted. Extremities:  No clubbing or edema.  No cyanosis. Neurologic:  Alert and oriented x3;  grossly normal neurologically. Skin:  Intact without significant lesions or rashes.  No jaundice. Lymph Nodes:  No significant cervical adenopathy. Psych:  Alert and cooperative. Normal mood and affect.  Imaging Studies: No results found.  Assessment and Plan:   [name] is a 84 y.o. y/o female who comes today with a feeling of fullness when she swallows and states that in the epigastric area.  The patient states that her trouble swallowing may be with rice or even liquids and not more with solids.  The patient will be set up for a barium swallow to see if her esophagus has any strictures or if this is a motility disorder.  If there is a stricture or narrowing the patient has been told that she may need an upper endoscopy.  The patient has been explained the plan and agrees with it.  Midge Minium, MD. Clementeen Graham   Note: This dictation was prepared with Dragon dictation along with smaller phrase technology. Any transcriptional errors that result from this process are unintentional.

## 2017-12-21 ENCOUNTER — Ambulatory Visit
Admission: RE | Admit: 2017-12-21 | Discharge: 2017-12-21 | Disposition: A | Discharge status: Home / Self Care | Payer: Medicare Other | Source: Ambulatory Visit | Attending: Gastroenterology | Admitting: Gastroenterology

## 2017-12-21 DIAGNOSIS — K228 Other specified diseases of esophagus: Secondary | ICD-10-CM | POA: Insufficient documentation

## 2017-12-21 DIAGNOSIS — R0989 Other specified symptoms and signs involving the circulatory and respiratory systems: Secondary | ICD-10-CM

## 2017-12-21 DIAGNOSIS — F458 Other somatoform disorders: Secondary | ICD-10-CM | POA: Diagnosis not present

## 2017-12-21 DIAGNOSIS — K449 Diaphragmatic hernia without obstruction or gangrene: Secondary | ICD-10-CM | POA: Insufficient documentation

## 2017-12-21 DIAGNOSIS — K222 Esophageal obstruction: Secondary | ICD-10-CM | POA: Diagnosis not present

## 2017-12-23 ENCOUNTER — Telehealth: Payer: Self-pay

## 2017-12-23 ENCOUNTER — Other Ambulatory Visit: Payer: Self-pay

## 2017-12-23 DIAGNOSIS — R1310 Dysphagia, unspecified: Secondary | ICD-10-CM

## 2017-12-23 DIAGNOSIS — K222 Esophageal obstruction: Secondary | ICD-10-CM

## 2017-12-23 DIAGNOSIS — R1319 Other dysphagia: Secondary | ICD-10-CM

## 2017-12-23 NOTE — Telephone Encounter (Signed)
Advised patient of results per Dr. Servando SnareWohl:  - Let the patient know that the barium swallow showed a stricture in her esophagus that can be stretched if she would agree to undergoing an upper endoscopy. The patient had previously called and was questioning whether she should have a colonoscopy so if she decides to have them both done we can do them at the same time.   Ms. Nancy Green states she does not want a colonoscopy.  Scheduled the EGD with dilation for 12/29/17.

## 2017-12-26 ENCOUNTER — Encounter: Payer: Self-pay | Admitting: Anesthesiology

## 2017-12-28 NOTE — Discharge Instructions (Signed)
General Anesthesia, Adult, Care After °These instructions provide you with information about caring for yourself after your procedure. Your health care provider may also give you more specific instructions. Your treatment has been planned according to current medical practices, but problems sometimes occur. Call your health care provider if you have any problems or questions after your procedure. °What can I expect after the procedure? °After the procedure, it is common to have: °· Vomiting. °· A sore throat. °· Mental slowness. ° °It is common to feel: °· Nauseous. °· Cold or shivery. °· Sleepy. °· Tired. °· Sore or achy, even in parts of your body where you did not have surgery. ° °Follow these instructions at home: °For at least 24 hours after the procedure: °· Do not: °? Participate in activities where you could fall or become injured. °? Drive. °? Use heavy machinery. °? Drink alcohol. °? Take sleeping pills or medicines that cause drowsiness. °? Make important decisions or sign legal documents. °? Take care of children on your own. °· Rest. °Eating and drinking °· If you vomit, drink water, juice, or soup when you can drink without vomiting. °· Drink enough fluid to keep your urine clear or pale yellow. °· Make sure you have little or no nausea before eating solid foods. °· Follow the diet recommended by your health care provider. °General instructions °· Have a responsible adult stay with you until you are awake and alert. °· Return to your normal activities as told by your health care provider. Ask your health care provider what activities are safe for you. °· Take over-the-counter and prescription medicines only as told by your health care provider. °· If you smoke, do not smoke without supervision. °· Keep all follow-up visits as told by your health care provider. This is important. °Contact a health care provider if: °· You continue to have nausea or vomiting at home, and medicines are not helpful. °· You  cannot drink fluids or start eating again. °· You cannot urinate after 8-12 hours. °· You develop a skin rash. °· You have fever. °· You have increasing redness at the site of your procedure. °Get help right away if: °· You have difficulty breathing. °· You have chest pain. °· You have unexpected bleeding. °· You feel that you are having a life-threatening or urgent problem. °This information is not intended to replace advice given to you by your health care provider. Make sure you discuss any questions you have with your health care provider. °Document Released: 11/22/2000 Document Revised: 01/19/2016 Document Reviewed: 07/31/2015 °Elsevier Interactive Patient Education © 2018 Elsevier Inc. ° °

## 2017-12-29 ENCOUNTER — Ambulatory Visit
Admission: RE | Admit: 2017-12-29 | Discharge: 2017-12-29 | Disposition: A | Discharge status: Home / Self Care | Payer: Medicare Other | Source: Ambulatory Visit | Attending: Gastroenterology | Admitting: Gastroenterology

## 2017-12-29 ENCOUNTER — Encounter
Admission: RE | Disposition: A | Discharge status: Home / Self Care | Payer: Self-pay | Source: Ambulatory Visit | Attending: Gastroenterology

## 2017-12-29 DIAGNOSIS — Z7902 Long term (current) use of antithrombotics/antiplatelets: Secondary | ICD-10-CM | POA: Insufficient documentation

## 2017-12-29 DIAGNOSIS — R1310 Dysphagia, unspecified: Secondary | ICD-10-CM | POA: Diagnosis present

## 2017-12-29 DIAGNOSIS — Z538 Procedure and treatment not carried out for other reasons: Secondary | ICD-10-CM | POA: Diagnosis not present

## 2017-12-29 SURGERY — ESOPHAGOGASTRODUODENOSCOPY (EGD) WITH PROPOFOL
Anesthesia: Choice

## 2017-12-29 SURGICAL SUPPLY — 33 items
BALLN DILATOR 10-12 8 (BALLOONS)
BALLN DILATOR 12-15 8 (BALLOONS)
BALLN DILATOR 15-18 8 (BALLOONS)
BALLN DILATOR CRE 0-12 8 (BALLOONS)
BALLN DILATOR ESOPH 8 10 CRE (MISCELLANEOUS) IMPLANT
BALLOON DILATOR 12-15 8 (BALLOONS) IMPLANT
BALLOON DILATOR 15-18 8 (BALLOONS) IMPLANT
BALLOON DILATOR CRE 0-12 8 (BALLOONS) IMPLANT
BLOCK BITE 60FR ADLT L/F GRN (MISCELLANEOUS) ×4 IMPLANT
CANISTER SUCT 1200ML W/VALVE (MISCELLANEOUS) ×4 IMPLANT
CLIP HMST 235XBRD CATH ROT (MISCELLANEOUS) IMPLANT
CLIP RESOLUTION 360 11X235 (MISCELLANEOUS)
ELECT REM PT RETURN 9FT ADLT (ELECTROSURGICAL)
ELECTRODE REM PT RTRN 9FT ADLT (ELECTROSURGICAL) IMPLANT
FCP ESCP3.2XJMB 240X2.8X (MISCELLANEOUS)
FORCEPS BIOP RAD 4 LRG CAP 4 (CUTTING FORCEPS) IMPLANT
FORCEPS BIOP RJ4 240 W/NDL (MISCELLANEOUS)
FORCEPS ESCP3.2XJMB 240X2.8X (MISCELLANEOUS) IMPLANT
GOWN CVR UNV OPN BCK APRN NK (MISCELLANEOUS) ×4 IMPLANT
GOWN ISOL THUMB LOOP REG UNIV (MISCELLANEOUS) ×6
INJECTOR VARIJECT VIN23 (MISCELLANEOUS) IMPLANT
KIT DEFENDO VALVE AND CONN (KITS) IMPLANT
KIT ENDO PROCEDURE OLY (KITS) ×4 IMPLANT
MARKER SPOT ENDO TATTOO 5ML (MISCELLANEOUS) IMPLANT
RETRIEVER NET PLAT FOOD (MISCELLANEOUS) IMPLANT
SNARE SHORT THROW 13M SML OVAL (MISCELLANEOUS) IMPLANT
SNARE SHORT THROW 30M LRG OVAL (MISCELLANEOUS) IMPLANT
SPOT EX ENDOSCOPIC TATTOO (MISCELLANEOUS)
SYR INFLATION 60ML (SYRINGE) IMPLANT
TRAP ETRAP POLY (MISCELLANEOUS) IMPLANT
VARIJECT INJECTOR VIN23 (MISCELLANEOUS)
WATER STERILE IRR 250ML POUR (IV SOLUTION) ×4 IMPLANT
WIRE CRE 18-20MM 8CM F G (MISCELLANEOUS) IMPLANT

## 2017-12-30 NOTE — Discharge Instructions (Signed)
General Anesthesia, Adult, Care After °These instructions provide you with information about caring for yourself after your procedure. Your health care provider may also give you more specific instructions. Your treatment has been planned according to current medical practices, but problems sometimes occur. Call your health care provider if you have any problems or questions after your procedure. °What can I expect after the procedure? °After the procedure, it is common to have: °· Vomiting. °· A sore throat. °· Mental slowness. ° °It is common to feel: °· Nauseous. °· Cold or shivery. °· Sleepy. °· Tired. °· Sore or achy, even in parts of your body where you did not have surgery. ° °Follow these instructions at home: °For at least 24 hours after the procedure: °· Do not: °? Participate in activities where you could fall or become injured. °? Drive. °? Use heavy machinery. °? Drink alcohol. °? Take sleeping pills or medicines that cause drowsiness. °? Make important decisions or sign legal documents. °? Take care of children on your own. °· Rest. °Eating and drinking °· If you vomit, drink water, juice, or soup when you can drink without vomiting. °· Drink enough fluid to keep your urine clear or pale yellow. °· Make sure you have little or no nausea before eating solid foods. °· Follow the diet recommended by your health care provider. °General instructions °· Have a responsible adult stay with you until you are awake and alert. °· Return to your normal activities as told by your health care provider. Ask your health care provider what activities are safe for you. °· Take over-the-counter and prescription medicines only as told by your health care provider. °· If you smoke, do not smoke without supervision. °· Keep all follow-up visits as told by your health care provider. This is important. °Contact a health care provider if: °· You continue to have nausea or vomiting at home, and medicines are not helpful. °· You  cannot drink fluids or start eating again. °· You cannot urinate after 8-12 hours. °· You develop a skin rash. °· You have fever. °· You have increasing redness at the site of your procedure. °Get help right away if: °· You have difficulty breathing. °· You have chest pain. °· You have unexpected bleeding. °· You feel that you are having a life-threatening or urgent problem. °This information is not intended to replace advice given to you by your health care provider. Make sure you discuss any questions you have with your health care provider. °Document Released: 11/22/2000 Document Revised: 01/19/2016 Document Reviewed: 07/31/2015 °Elsevier Interactive Patient Education © 2018 Elsevier Inc. ° °

## 2018-01-02 ENCOUNTER — Ambulatory Visit
Admission: RE | Admit: 2018-01-02 | Discharge: 2018-01-02 | Disposition: A | Discharge status: Home / Self Care | Payer: Medicare Other | Source: Ambulatory Visit | Attending: Gastroenterology | Admitting: Gastroenterology

## 2018-01-02 ENCOUNTER — Ambulatory Visit: Payer: Medicare Other | Admitting: Anesthesiology

## 2018-01-02 ENCOUNTER — Encounter
Admission: RE | Disposition: A | Discharge status: Home / Self Care | Payer: Self-pay | Source: Ambulatory Visit | Attending: Gastroenterology

## 2018-01-02 DIAGNOSIS — E1151 Type 2 diabetes mellitus with diabetic peripheral angiopathy without gangrene: Secondary | ICD-10-CM | POA: Insufficient documentation

## 2018-01-02 DIAGNOSIS — K219 Gastro-esophageal reflux disease without esophagitis: Secondary | ICD-10-CM | POA: Diagnosis not present

## 2018-01-02 DIAGNOSIS — Z880 Allergy status to penicillin: Secondary | ICD-10-CM | POA: Insufficient documentation

## 2018-01-02 DIAGNOSIS — Z794 Long term (current) use of insulin: Secondary | ICD-10-CM | POA: Insufficient documentation

## 2018-01-02 DIAGNOSIS — I10 Essential (primary) hypertension: Secondary | ICD-10-CM | POA: Insufficient documentation

## 2018-01-02 DIAGNOSIS — M109 Gout, unspecified: Secondary | ICD-10-CM | POA: Insufficient documentation

## 2018-01-02 DIAGNOSIS — R011 Cardiac murmur, unspecified: Secondary | ICD-10-CM | POA: Insufficient documentation

## 2018-01-02 DIAGNOSIS — Z87891 Personal history of nicotine dependence: Secondary | ICD-10-CM | POA: Diagnosis not present

## 2018-01-02 DIAGNOSIS — Z79899 Other long term (current) drug therapy: Secondary | ICD-10-CM | POA: Diagnosis not present

## 2018-01-02 DIAGNOSIS — K449 Diaphragmatic hernia without obstruction or gangrene: Secondary | ICD-10-CM | POA: Diagnosis not present

## 2018-01-02 DIAGNOSIS — K222 Esophageal obstruction: Secondary | ICD-10-CM | POA: Diagnosis not present

## 2018-01-02 DIAGNOSIS — H548 Legal blindness, as defined in USA: Secondary | ICD-10-CM | POA: Insufficient documentation

## 2018-01-02 DIAGNOSIS — M19049 Primary osteoarthritis, unspecified hand: Secondary | ICD-10-CM | POA: Insufficient documentation

## 2018-01-02 DIAGNOSIS — R1310 Dysphagia, unspecified: Secondary | ICD-10-CM | POA: Diagnosis present

## 2018-01-02 DIAGNOSIS — Z888 Allergy status to other drugs, medicaments and biological substances status: Secondary | ICD-10-CM | POA: Insufficient documentation

## 2018-01-02 HISTORY — PX: ESOPHAGEAL DILATION: SHX303

## 2018-01-02 HISTORY — PX: ESOPHAGOGASTRODUODENOSCOPY (EGD) WITH PROPOFOL: SHX5813

## 2018-01-02 LAB — GLUCOSE, CAPILLARY: Glucose-Capillary: 133 mg/dL — ABNORMAL HIGH (ref 65–99)

## 2018-01-02 SURGERY — ESOPHAGOGASTRODUODENOSCOPY (EGD) WITH PROPOFOL
Anesthesia: General | Site: Throat | Wound class: Clean Contaminated

## 2018-01-02 MED ORDER — ACETAMINOPHEN 160 MG/5ML PO SOLN: 325.0000 mg | Freq: Once | ORAL | Status: DC

## 2018-01-02 MED ORDER — LIDOCAINE HCL (CARDIAC) PF 100 MG/5ML IV SOSY
PREFILLED_SYRINGE | INTRAVENOUS | Status: DC | PRN
  Administered 2018-01-02: 20 mg via INTRAVENOUS

## 2018-01-02 MED ORDER — STERILE WATER FOR IRRIGATION IR SOLN
Status: DC | PRN
  Administered 2018-01-02: .5 mL

## 2018-01-02 MED ORDER — LACTATED RINGERS IV SOLN: 10.0000 mL/h | INTRAVENOUS | Status: DC

## 2018-01-02 MED ORDER — PROPOFOL 10 MG/ML IV BOLUS
INTRAVENOUS | Status: DC | PRN
  Administered 2018-01-02: 70 mg via INTRAVENOUS
  Administered 2018-01-02: 20 mg via INTRAVENOUS
  Administered 2018-01-02: 10 mg via INTRAVENOUS

## 2018-01-02 MED ORDER — ACETAMINOPHEN 325 MG PO TABS: 325.0000 mg | ORAL_TABLET | Freq: Once | ORAL | Status: DC

## 2018-01-02 MED ORDER — GLYCOPYRROLATE 0.2 MG/ML IJ SOLN
INTRAMUSCULAR | Status: DC | PRN
  Administered 2018-01-02: 0.1 mg via INTRAVENOUS

## 2018-01-02 MED ORDER — LACTATED RINGERS IV SOLN
INTRAVENOUS | Status: DC
  Administered 2018-01-02: 10:00:00 via INTRAVENOUS

## 2018-01-02 SURGICAL SUPPLY — 9 items
BALLN DILATOR 15-18 8 (BALLOONS) ×4
BALLOON DILATOR 15-18 8 (BALLOONS) IMPLANT
BLOCK BITE 60FR ADLT L/F GRN (MISCELLANEOUS) ×4 IMPLANT
CANISTER SUCT 1200ML W/VALVE (MISCELLANEOUS) ×4 IMPLANT
GOWN CVR UNV OPN BCK APRN NK (MISCELLANEOUS) ×4 IMPLANT
GOWN ISOL THUMB LOOP REG UNIV (MISCELLANEOUS) ×8
KIT ENDO PROCEDURE OLY (KITS) ×4 IMPLANT
SYR INFLATION 60ML (SYRINGE) ×2 IMPLANT
WATER STERILE IRR 250ML POUR (IV SOLUTION) ×4 IMPLANT

## 2018-01-02 NOTE — Transfer of Care (Signed)
Immediate Anesthesia Transfer of Care Note  Patient: Nancy Green  Procedure(s) Performed: ESOPHAGOGASTRODUODENOSCOPY (EGD) WITH PROPOFOL (N/A Mouth) ESOPHAGEAL DILATION (Throat)  Patient Location: PACU  Anesthesia Type: General  Level of Consciousness: awake, alert  and patient cooperative  Airway and Oxygen Therapy: Patient Spontanous Breathing and Patient connected to supplemental oxygen  Post-op Assessment: Post-op Vital signs reviewed, Patient's Cardiovascular Status Stable, Respiratory Function Stable, Patent Airway and No signs of Nausea or vomiting  Post-op Vital Signs: Reviewed and stable  Complications: No apparent anesthesia complications

## 2018-01-02 NOTE — Anesthesia Postprocedure Evaluation (Signed)
Anesthesia Post Note  Patient: Aideliz Garmany  Procedure(s) Performed: ESOPHAGOGASTRODUODENOSCOPY (EGD) WITH PROPOFOL (N/A Mouth) ESOPHAGEAL DILATION (Throat)  Patient location during evaluation: PACU Anesthesia Type: General Level of consciousness: awake and alert and oriented Pain management: satisfactory to patient Vital Signs Assessment: post-procedure vital signs reviewed and stable Respiratory status: spontaneous breathing, nonlabored ventilation and respiratory function stable Cardiovascular status: blood pressure returned to baseline and stable Postop Assessment: Adequate PO intake and No signs of nausea or vomiting Anesthetic complications: no    Cherly Beach

## 2018-01-02 NOTE — Op Note (Signed)
[facility] Gastroenterology Patient Name: Nancy Green Procedure Date: 01/02/2018 10:37 AM MRN: [id number] Account #: [id number] Date of Birth: [birth year] Admit Type: Outpatient Age: 84 Room: [facility] OR ROOM 01 Gender: Female Note Status: Finalized Procedure:            Upper GI endoscopy Indications:          Dysphagia Providers:            Midge Minium MD, MD Referring MD:         Caryl Asp (Referring MD) Medicines:            Propofol per Anesthesia Complications:        No immediate complications. Procedure:            Pre-Anesthesia Assessment:                       - Prior to the procedure, a History and Physical was                        performed, and patient medications and allergies were                        reviewed. The patient's tolerance of previous                        anesthesia was also reviewed. The risks and benefits of                        the procedure and the sedation options and risks were                        discussed with the patient. All questions were                        answered, and informed consent was obtained. Prior                        Anticoagulants: The patient has taken no previous                        anticoagulant or antiplatelet agents. ASA Grade                        Assessment: II - A patient with mild systemic disease.                        After reviewing the risks and benefits, the patient was                        deemed in satisfactory condition to undergo the                        procedure.                       After obtaining informed consent, the endoscope was                        passed under direct vision. Throughout the procedure,  the patient's blood pressure, pulse, and oxygen                        saturations were monitored continuously. The Olympus                        [id number]-[id number] Endoscope (S#. [id number]) was introduced                        through the  mouth, and advanced to the second part of                        duodenum. The upper GI endoscopy was accomplished                        without difficulty. The patient tolerated the procedure                        well. Findings:      A small hiatal hernia was present.      One benign-appearing, intrinsic mild stenosis was found at the       gastroesophageal junction. The stenosis was traversed. A TTS dilator was       passed through the scope. Dilation with a 15-16.5-18 mm balloon dilator       was performed to 18 mm. The dilation site was examined following       endoscope reinsertion and showed complete resolution of luminal       narrowing.      Food was found in the entire examined stomach.      Food (residue) was found in the duodenal bulb. Impression:           - Small hiatal hernia.                       - Benign-appearing esophageal stenosis. Dilated.                       - Food was found in the stomach.                       - Retained food in the duodenum.                       - No specimens collected. Recommendation:       - Discharge patient to home.                       - Resume previous diet.                       - Continue present medications. Procedure Code(s):    --- Professional ---                       [phone], Esophagogastroduodenoscopy, flexible, transoral;                        with transendoscopic balloon dilation of esophagus                        (less than 30 mm diameter) Diagnosis Code(s):    --- Professional ---  R13.10, Dysphagia, unspecified                       K22.2, Esophageal obstruction CPT copyright 2017 American Medical Association. All rights reserved. The codes documented in this report are preliminary and upon coder review may  be revised to meet current compliance requirements. Midge Minium MD, MD 01/02/2018 10:55:35 AM This report has been signed electronically. Number of Addenda: 0 Note Initiated On: 01/02/2018  10:37 AM      [facility]

## 2018-01-02 NOTE — Anesthesia Procedure Notes (Signed)
Procedure Name: MAC Performed by: Haylynn Pha, CRNA Pre-anesthesia Checklist: Emergency Drugs available, Patient identified, Suction available, Patient being monitored and Timeout performed Patient Re-evaluated:Patient Re-evaluated prior to induction Oxygen Delivery Method: Nasal cannula       

## 2018-01-02 NOTE — Anesthesia Preprocedure Evaluation (Signed)
Anesthesia Evaluation  Patient identified by MRN, date of birth, ID band Patient awake    Reviewed: Allergy & Precautions, H&P , NPO status , Patient's Chart, lab work & pertinent test results  Airway Mallampati: II  TM Distance: >3 FB Neck ROM: full    Dental no notable dental hx. (+) Upper Dentures, Lower Dentures   Pulmonary former smoker,    Pulmonary exam normal breath sounds clear to auscultation       Cardiovascular hypertension, + Peripheral Vascular Disease  Normal cardiovascular exam+ Valvular Problems/Murmurs  Rhythm:regular Rate:Normal     Neuro/Psych    GI/Hepatic GERD  ,  Endo/Other  diabetes, Insulin Dependent, Oral Hypoglycemic Agents  Renal/GU      Musculoskeletal   Abdominal   Peds  Hematology   Anesthesia Other Findings   Reproductive/Obstetrics                             Anesthesia Physical Anesthesia Plan  ASA: III  Anesthesia Plan: General   Post-op Pain Management:    Induction: Intravenous  PONV Risk Score and Plan: 3 and Propofol infusion and Treatment may vary due to age or medical condition  Airway Management Planned: Natural Airway  Additional Equipment:   Intra-op Plan:   Post-operative Plan:   Informed Consent: I have reviewed the patients History and Physical, chart, labs and discussed the procedure including the risks, benefits and alternatives for the proposed anesthesia with the patient or authorized representative who has indicated his/her understanding and acceptance.     Plan Discussed with: CRNA  Anesthesia Plan Comments:         Anesthesia Quick Evaluation

## 2018-01-02 NOTE — H&P (Signed)
Midge Minium, MD [facility] [address], [address] [place], [place] [address] Phone:[phone] Fax : [phone]  Primary Care Physician:  Myrene Buddy, NP Primary Gastroenterologist:  Dr. Servando Snare  Pre-Procedure History & Physical: HPI:  Nancy Green is a 84 y.o. female is here for an endoscopy.   Past Medical History:  Diagnosis Date  . Aneurysm (HCC) 1967   removed yrs ago - craniotomy  . Arthritis    fingers  . Diabetes mellitus without complication (HCC)   . GERD (gastroesophageal reflux disease)   . Gout   . Heart murmur    followed by PCP  . History of hiatal hernia    "repaired"  . Hypertension   . Legal blindness of left eye, as defined in [place]     Past Surgical History:  Procedure Laterality Date  . ABDOMINAL HYSTERECTOMY    . APPENDECTOMY    . CATARACT EXTRACTION W/PHACO Left 09/17/2015   Procedure: CATARACT EXTRACTION PHACO AND INTRAOCULAR LENS PLACEMENT (IOC);  Surgeon: Lockie Mola, MD;  Location: [facility];  Service: Ophthalmology;  Laterality: Left;  DIABETIC - insulin and oral meds  . CATARACT EXTRACTION W/PHACO Right 10/22/2015   Procedure: CATARACT EXTRACTION PHACO AND INTRAOCULAR LENS PLACEMENT (IOC);  Surgeon: Lockie Mola, MD;  Location: [facility];  Service: Ophthalmology;  Laterality: Right;  DIABETIC - insulin and oral meds  . CERVICAL DISC SURGERY    . CHOLECYSTECTOMY    . COLONOSCOPY    . CRANIOTOMY  1967  . hiatel hernia     . RECTAL SURGERY      Prior to Admission medications   Medication Sig Start Date End Date Taking? Authorizing Provider  allopurinol (ZYLOPRIM) 100 MG tablet Take 100 mg by mouth daily.   Yes [provider]  diazepam (VALIUM) 5 MG tablet Take 5 mg by mouth as needed for anxiety.   Yes [provider]  insulin aspart (NOVOLOG FLEXPEN) 100 UNIT/ML FlexPen Inject into the skin 3 (three) times daily with meals.   Yes [provider]  loratadine  (CLARITIN) 10 MG tablet Take 10 mg by mouth daily as needed for allergies.   Yes [provider]  losartan (COZAAR) 100 MG tablet Take 100 mg by mouth daily.   Yes [provider]  metFORMIN (GLUCOPHAGE) 500 MG tablet Take by mouth at bedtime.   Yes [provider]  NIFEdipine (PROCARDIA XL/ADALAT-CC) 60 MG 24 hr tablet Take 60 mg by mouth daily.   Yes [provider]  omeprazole (PRILOSEC) 40 MG capsule Take 40 mg by mouth daily.   Yes [provider]  predniSONE (DELTASONE) 10 MG tablet Take 12.5 mg by mouth daily with breakfast.    Yes [provider]  triamterene-hydrochlorothiazide (DYAZIDE) 37.5-25 MG per capsule Take 1 capsule by mouth daily.    Yes [provider]  Vitamin D, Ergocalciferol, (DRISDOL) 50000 units CAPS capsule TAKE 1 CAPSULE BY MOUTH EVERY 14 DAYS 03/30/17  Yes [provider]  albuterol (PROVENTIL HFA;VENTOLIN HFA) 108 (90 BASE) MCG/ACT inhaler Inhale 2 puffs into the lungs every 4 (four) hours as needed for wheezing or shortness of breath. Patient not taking: Reported on 12/26/2017 05/28/15   Cuthriell, Delorise Royals, PA-C  clopidogrel (PLAVIX) 75 MG tablet Take 75 mg by mouth daily.    [provider]  pravastatin (PRAVACHOL) 10 MG tablet Take by mouth. 12/21/16 12/26/17  [provider]    Allergies as of 12/29/2017 - Review Complete 12/29/2017  Allergen  Reaction Noted  . Neomycin Rash 07/26/2011  . Zantac [ranitidine hcl] Hives 12/24/2014  . Penicillin g Rash 12/24/2014    History reviewed. No pertinent family history.  Social History   Socioeconomic History  . Marital status: Widowed    Spouse name: Not on file  . Number of children: Not on file  . Years of education: Not on file  . Highest education level: Not on file  Occupational History  . Not on file  Social Needs  . Financial resource strain: Not on file  . Food insecurity:    Worry: Not on file    Inability: Not  on file  . Transportation needs:    Medical: Not on file    Non-medical: Not on file  Tobacco Use  . Smoking status: Former Games developer  . Smokeless tobacco: Never Used  . Tobacco comment: quit 20+ yrs ago  Substance and Sexual Activity  . Alcohol use: No  . Drug use: Never  . Sexual activity: Not on file  Lifestyle  . Physical activity:    Days per week: Not on file    Minutes per session: Not on file  . Stress: Not on file  Relationships  . Social connections:    Talks on phone: Not on file    Gets together: Not on file    Attends religious service: Not on file    Active member of club or organization: Not on file    Attends meetings of clubs or organizations: Not on file    Relationship status: Not on file  . Intimate partner violence:    Fear of current or ex partner: Not on file    Emotionally abused: Not on file    Physically abused: Not on file    Forced sexual activity: Not on file  Other Topics Concern  . Not on file  Social History Narrative  . Not on file    Review of Systems: See HPI, otherwise negative ROS  Physical Exam: BP (!) 187/60   Pulse 90   Temp 98.1 F (36.7 C) (Temporal)   Ht 5' (1.524 m)   Wt 110 lb (49.9 kg)   BMI 21.48 kg/m  General:   Alert,  pleasant and cooperative in NAD Head:  Normocephalic and atraumatic. Neck:  Supple; no masses or thyromegaly. Lungs:  Clear throughout to auscultation.    Heart:  Regular rate and rhythm. Abdomen:  Soft, nontender and nondistended. Normal bowel sounds, without guarding, and without rebound.   Neurologic:  Alert and  oriented x4;  grossly normal neurologically.  Impression/Plan: Nancy Green is here for an endoscopy to be performed for dysphagia  Risks, benefits, limitations, and alternatives regarding  endoscopy have been reviewed with the patient.  Questions have been answered.  All parties agreeable.   Midge Minium, MD  01/02/2018, 10:28 AM

## 2018-01-03 ENCOUNTER — Encounter: Payer: Self-pay | Admitting: Gastroenterology

## 2018-02-24 ENCOUNTER — Encounter (INDEPENDENT_AMBULATORY_CARE_PROVIDER_SITE_OTHER): Payer: Medicare Other

## 2018-02-24 ENCOUNTER — Ambulatory Visit (INDEPENDENT_AMBULATORY_CARE_PROVIDER_SITE_OTHER): Payer: Medicare Other | Admitting: Vascular Surgery

## 2018-04-14 ENCOUNTER — Ambulatory Visit (INDEPENDENT_AMBULATORY_CARE_PROVIDER_SITE_OTHER): Payer: Medicare Other | Admitting: Vascular Surgery

## 2018-04-14 ENCOUNTER — Encounter (INDEPENDENT_AMBULATORY_CARE_PROVIDER_SITE_OTHER): Payer: Medicare Other

## 2018-06-15 ENCOUNTER — Other Ambulatory Visit (INDEPENDENT_AMBULATORY_CARE_PROVIDER_SITE_OTHER): Payer: Self-pay | Admitting: Vascular Surgery

## 2018-06-15 DIAGNOSIS — I739 Peripheral vascular disease, unspecified: Secondary | ICD-10-CM

## 2018-06-16 ENCOUNTER — Encounter (INDEPENDENT_AMBULATORY_CARE_PROVIDER_SITE_OTHER): Payer: Self-pay | Admitting: Vascular Surgery

## 2018-06-16 ENCOUNTER — Encounter

## 2018-06-16 ENCOUNTER — Ambulatory Visit (INDEPENDENT_AMBULATORY_CARE_PROVIDER_SITE_OTHER): Payer: Medicare Other | Admitting: Nurse Practitioner

## 2018-06-16 ENCOUNTER — Ambulatory Visit (INDEPENDENT_AMBULATORY_CARE_PROVIDER_SITE_OTHER): Payer: Medicare Other

## 2018-06-16 VITALS — BP 172/61 | HR 90 | Resp 16 | Ht 60.0 in | Wt 111.0 lb

## 2018-06-16 DIAGNOSIS — E119 Type 2 diabetes mellitus without complications: Secondary | ICD-10-CM | POA: Diagnosis not present

## 2018-06-16 DIAGNOSIS — I739 Peripheral vascular disease, unspecified: Secondary | ICD-10-CM

## 2018-06-16 DIAGNOSIS — Z87891 Personal history of nicotine dependence: Secondary | ICD-10-CM | POA: Diagnosis not present

## 2018-06-16 DIAGNOSIS — K219 Gastro-esophageal reflux disease without esophagitis: Secondary | ICD-10-CM

## 2018-06-21 ENCOUNTER — Encounter (INDEPENDENT_AMBULATORY_CARE_PROVIDER_SITE_OTHER): Payer: Self-pay | Admitting: Nurse Practitioner

## 2018-06-21 NOTE — Progress Notes (Signed)
Subjective:    Patient ID: Nancy Green, female    DOB: Oct 02, 1929, 84 y.o.   MRN: [id number] Chief Complaint  Patient presents with  . Follow-up    57yr abi    HPI  Coumba Kellison is a 84 y.o. female that returns to the office for followup and review of the noninvasive studies. There have been no interval changes in lower extremity symptoms. No interval shortening of the patient's claudication distance or development of rest pain symptoms. No new ulcers or wounds have occurred since the last visit.  There have been no significant changes to the patient's overall health care.  The patient denies amaurosis fugax or recent TIA symptoms. There are no recent neurological changes noted. The patient denies history of DVT, PE or superficial thrombophlebitis. The patient denies recent episodes of angina or shortness of breath.   ABI Rt=0.94 and Lt=0.99  (previous ABI's Rt=0.84 and Lt=0.94). Triphasic waveforms present bilaterally.   Past Medical History:  Diagnosis Date  . Aneurysm (HCC) 1967   removed yrs ago - craniotomy  . Arthritis    fingers  . Diabetes mellitus without complication (HCC)   . GERD (gastroesophageal reflux disease)   . Gout   . Heart murmur    followed by PCP  . History of hiatal hernia    "repaired"  . Hypertension   . Legal blindness of left eye, as defined in [place]     Past Surgical History:  Procedure Laterality Date  . ABDOMINAL HYSTERECTOMY    . APPENDECTOMY    . CATARACT EXTRACTION W/PHACO Left 09/17/2015   Procedure: CATARACT EXTRACTION PHACO AND INTRAOCULAR LENS PLACEMENT (IOC);  Surgeon: Lockie Mola, MD;  Location: [facility];  Service: Ophthalmology;  Laterality: Left;  DIABETIC - insulin and oral meds  . CATARACT EXTRACTION W/PHACO Right 10/22/2015   Procedure: CATARACT EXTRACTION PHACO AND INTRAOCULAR LENS PLACEMENT (IOC);  Surgeon: Lockie Mola, MD;  Location: [facility];  Service:  Ophthalmology;  Laterality: Right;  DIABETIC - insulin and oral meds  . CERVICAL DISC SURGERY    . CHOLECYSTECTOMY    . COLONOSCOPY    . CRANIOTOMY  1967  . ESOPHAGEAL DILATION  01/02/2018   Procedure: ESOPHAGEAL DILATION;  Surgeon: Midge Minium, MD;  Location: [facility];  Service: Endoscopy;;  . ESOPHAGOGASTRODUODENOSCOPY (EGD) WITH PROPOFOL N/A 01/02/2018   Procedure: ESOPHAGOGASTRODUODENOSCOPY (EGD) WITH PROPOFOL;  Surgeon: Midge Minium, MD;  Location: [facility];  Service: Endoscopy;  Laterality: N/A;  DIABETIC  . hiatel hernia     . RECTAL SURGERY      Social History   Socioeconomic History  . Marital status: Widowed    Spouse name: Not on file  . Number of children: Not on file  . Years of education: Not on file  . Highest education level: Not on file  Occupational History  . Not on file  Social Needs  . Financial resource strain: Not on file  . Food insecurity:    Worry: Not on file    Inability: Not on file  . Transportation needs:    Medical: Not on file    Non-medical: Not on file  Tobacco Use  . Smoking status: Former Games developer  . Smokeless tobacco: Never Used  . Tobacco comment: quit 20+ yrs ago  Substance and Sexual Activity  . Alcohol use: No  . Drug use: Never  . Sexual activity: Not on file  Lifestyle  . Physical activity:    Days per week:  Not on file    Minutes per session: Not on file  . Stress: Not on file  Relationships  . Social connections:    Talks on phone: Not on file    Gets together: Not on file    Attends religious service: Not on file    Active member of club or organization: Not on file    Attends meetings of clubs or organizations: Not on file    Relationship status: Not on file  . Intimate partner violence:    Fear of current or ex partner: Not on file    Emotionally abused: Not on file    Physically abused: Not on file    Forced sexual activity: Not on file  Other Topics Concern  . Not on file  Social History  Narrative  . Not on file    Family History  Problem Relation Age of Onset  . Varicose Veins Neg Hx   . Vision loss Neg Hx     Allergies  Allergen Reactions  . Neomycin Rash  . Zantac [Ranitidine Hcl] Hives       . Penicillin G Rash     Review of Systems   Review of Systems: Negative Unless Checked Constitutional: [] Weight loss  [] Fever  [] Chills Cardiac: [] Chest pain   []  Atrial Fibrillation  [] Palpitations   [] Shortness of breath when laying flat   [] Shortness of breath with exertion. Vascular:  [] Pain in legs with walking   [] Pain in legs with standing  [] History of DVT   [] Phlebitis   [] Swelling in legs   [] Varicose veins   [] Non-healing ulcers Pulmonary:   [] Uses home oxygen   [] Productive cough   [] Hemoptysis   [] Wheeze  [] COPD   [] Asthma Neurologic:  [] Dizziness   [] Seizures   [x] History of stroke   [] History of TIA  [] Aphasia   [] Vissual changes   [] Weakness or numbness in arm   [x] Weakness or numbness in leg Musculoskeletal:   [] Joint swelling   [] Joint pain   [x] Low back pain  []  History of Knee Replacement Hematologic:  [] Easy bruising  [] Easy bleeding   [] Hypercoagulable state   [] Anemic Gastrointestinal:  [] Diarrhea   [] Vomiting  [x] Gastroesophageal reflux/heartburn   [] Difficulty swallowing. Genitourinary:  [] Chronic kidney disease   [] Difficult urination  [] Anuric   [] Blood in urine Skin:  [] Rashes   [] Ulcers  Psychological:  [] History of anxiety   []  History of major depression  []  Memory Difficulties     Objective:   Physical Exam  BP (!) 172/61 (BP Location: Left Arm)   Pulse 90   Resp 16   Ht 5' (1.524 m)   Wt 111 lb (50.3 kg)   BMI 21.68 kg/m   Gen: WD/WN, NAD Head: [place]/AT, No temporalis wasting.  Ear/Nose/Throat: Hearing grossly intact, nares w/o erythema or drainage Eyes: PER, EOMI, sclera nonicteric.  Neck: Supple, no masses.  No JVD.  Pulmonary:  Good air movement, no use of accessory muscles.  Cardiac: RRR Vascular:  Vessel Right Left    Radial Palpable Palpable  Dorsalis Pedis Palpable Palpable  Posterior Tibial Palpable Palpable   Gastrointestinal: soft, non-distended. No guarding/no peritoneal signs.  Musculoskeletal: Uses walker.  No deformity or atrophy.  Neurologic: Pain and light touch intact in extremities.  Symmetrical.  Speech is fluent. Motor exam as listed above. Psychiatric: Judgment intact, Mood & affect appropriate for pt's clinical situation. Dermatologic: No Venous rashes. No Ulcers Noted.  No changes consistent with cellulitis. Lymph : No Cervical lymphadenopathy, no lichenification or  skin changes of chronic lymphedema.      Assessment & Plan:   1. PAD (peripheral artery disease) (HCC) ABI Rt=0.94 and Lt=0.99  (previous ABI's Rt=0.84 and Lt=0.94). Triphasic waveforms present bilaterally.    Recommend:  The patient has evidence of atherosclerosis of the lower extremities with little claudication.  The patient does not voice lifestyle limiting changes at this point in time.  Noninvasive studies do not suggest clinically significant change.  No invasive studies, angiography or surgery at this time The patient should continue walking and begin a more formal exercise program.  The patient should continue antiplatelet therapy and aggressive treatment of the lipid abnormalities  No changes in the patient's medications at this time  The patient should continue wearing graduated compression socks 10-15 mmHg strength to control the mild edema.   - VAS [place] ABI WITH/WO TBI; Future  2. Gastroesophageal reflux disease, esophagitis presence not specified Continue PPI as already ordered, this medication has been reviewed and there are no changes at this time.  Avoidence of caffeine and alcohol  Moderate elevation of the head of the bed   3. Type 2 diabetes mellitus without complication, unspecified whether long term insulin use (HCC) Continue hypoglycemic medications as already ordered, these medications  have been reviewed and there are no changes at this time.  Hgb A1C to be monitored as already arranged by primary service    Current Outpatient Medications on File Prior to Visit  Medication Sig Dispense Refill  . allopurinol (ZYLOPRIM) 100 MG tablet Take 100 mg by mouth daily.    . clopidogrel (PLAVIX) 75 MG tablet Take 75 mg by mouth daily.    . diazepam (VALIUM) 5 MG tablet Take 5 mg by mouth as needed for anxiety.    . insulin aspart (NOVOLOG FLEXPEN) 100 UNIT/ML FlexPen Inject into the skin 3 (three) times daily with meals.    Marland Kitchen loratadine (CLARITIN) 10 MG tablet Take 10 mg by mouth daily as needed for allergies.    Marland Kitchen losartan (COZAAR) 100 MG tablet Take 100 mg by mouth daily.    . metFORMIN (GLUCOPHAGE) 500 MG tablet Take by mouth at bedtime.    Marland Kitchen NIFEdipine (PROCARDIA XL/ADALAT-CC) 60 MG 24 hr tablet Take 60 mg by mouth daily.    . pravastatin (PRAVACHOL) 10 MG tablet Take by mouth.    . predniSONE (DELTASONE) 10 MG tablet Take 12.5 mg by mouth daily with breakfast.     . triamterene-hydrochlorothiazide (DYAZIDE) 37.5-25 MG per capsule Take 1 capsule by mouth daily.     . Vitamin D, Ergocalciferol, (DRISDOL) 50000 units CAPS capsule TAKE 1 CAPSULE BY MOUTH EVERY 14 DAYS    . albuterol (PROVENTIL HFA;VENTOLIN HFA) 108 (90 BASE) MCG/ACT inhaler Inhale 2 puffs into the lungs every 4 (four) hours as needed for wheezing or shortness of breath. (Patient not taking: Reported on 12/26/2017) 1 Inhaler 0  . omeprazole (PRILOSEC) 40 MG capsule Take 40 mg by mouth daily.     No current facility-administered medications on file prior to visit.     There are no Patient Instructions on file for this visit. Return in about 1 year (around 06/17/2019).   Georgiana Spinner, NP  This note was completed with Office manager.  Any errors are purely unintentional.

## 2018-09-05 ENCOUNTER — Ambulatory Visit (INDEPENDENT_AMBULATORY_CARE_PROVIDER_SITE_OTHER): Payer: Medicare Other | Admitting: Vascular Surgery

## 2018-09-05 ENCOUNTER — Encounter (INDEPENDENT_AMBULATORY_CARE_PROVIDER_SITE_OTHER): Payer: Medicare Other

## 2018-12-19 ENCOUNTER — Ambulatory Visit (INDEPENDENT_AMBULATORY_CARE_PROVIDER_SITE_OTHER): Payer: Medicare Other | Admitting: Vascular Surgery

## 2018-12-19 ENCOUNTER — Encounter (INDEPENDENT_AMBULATORY_CARE_PROVIDER_SITE_OTHER): Payer: Medicare Other

## 2019-01-23 ENCOUNTER — Encounter (INDEPENDENT_AMBULATORY_CARE_PROVIDER_SITE_OTHER): Payer: Medicare Other

## 2019-01-23 ENCOUNTER — Ambulatory Visit (INDEPENDENT_AMBULATORY_CARE_PROVIDER_SITE_OTHER): Payer: Medicare Other | Admitting: Vascular Surgery

## 2019-02-02 ENCOUNTER — Encounter (INDEPENDENT_AMBULATORY_CARE_PROVIDER_SITE_OTHER): Payer: Medicare Other

## 2019-02-02 ENCOUNTER — Ambulatory Visit (INDEPENDENT_AMBULATORY_CARE_PROVIDER_SITE_OTHER): Payer: Medicare Other | Admitting: Vascular Surgery

## 2019-06-18 ENCOUNTER — Encounter: Payer: Self-pay | Admitting: Intensive Care

## 2019-06-18 ENCOUNTER — Observation Stay
Admission: EM | Admit: 2019-06-18 | Discharge: 2019-06-19 | Disposition: A | Discharge status: Home / Self Care | Payer: Medicare Other | Attending: Internal Medicine | Admitting: Internal Medicine

## 2019-06-18 ENCOUNTER — Other Ambulatory Visit: Payer: Self-pay

## 2019-06-18 DIAGNOSIS — Z79899 Other long term (current) drug therapy: Secondary | ICD-10-CM | POA: Insufficient documentation

## 2019-06-18 DIAGNOSIS — I10 Essential (primary) hypertension: Secondary | ICD-10-CM | POA: Diagnosis not present

## 2019-06-18 DIAGNOSIS — E1151 Type 2 diabetes mellitus with diabetic peripheral angiopathy without gangrene: Secondary | ICD-10-CM | POA: Diagnosis not present

## 2019-06-18 DIAGNOSIS — Z87891 Personal history of nicotine dependence: Secondary | ICD-10-CM | POA: Insufficient documentation

## 2019-06-18 DIAGNOSIS — M19041 Primary osteoarthritis, right hand: Secondary | ICD-10-CM | POA: Diagnosis not present

## 2019-06-18 DIAGNOSIS — D509 Iron deficiency anemia, unspecified: Principal | ICD-10-CM | POA: Insufficient documentation

## 2019-06-18 DIAGNOSIS — E079 Disorder of thyroid, unspecified: Secondary | ICD-10-CM | POA: Diagnosis not present

## 2019-06-18 DIAGNOSIS — H548 Legal blindness, as defined in USA: Secondary | ICD-10-CM | POA: Diagnosis not present

## 2019-06-18 DIAGNOSIS — Z880 Allergy status to penicillin: Secondary | ICD-10-CM | POA: Insufficient documentation

## 2019-06-18 DIAGNOSIS — Z7902 Long term (current) use of antithrombotics/antiplatelets: Secondary | ICD-10-CM | POA: Insufficient documentation

## 2019-06-18 DIAGNOSIS — Z7984 Long term (current) use of oral hypoglycemic drugs: Secondary | ICD-10-CM | POA: Insufficient documentation

## 2019-06-18 DIAGNOSIS — M19042 Primary osteoarthritis, left hand: Secondary | ICD-10-CM | POA: Diagnosis not present

## 2019-06-18 DIAGNOSIS — E785 Hyperlipidemia, unspecified: Secondary | ICD-10-CM | POA: Insufficient documentation

## 2019-06-18 DIAGNOSIS — M109 Gout, unspecified: Secondary | ICD-10-CM | POA: Diagnosis not present

## 2019-06-18 DIAGNOSIS — D649 Anemia, unspecified: Secondary | ICD-10-CM | POA: Diagnosis present

## 2019-06-18 DIAGNOSIS — Z20828 Contact with and (suspected) exposure to other viral communicable diseases: Secondary | ICD-10-CM | POA: Diagnosis not present

## 2019-06-18 DIAGNOSIS — M316 Other giant cell arteritis: Secondary | ICD-10-CM | POA: Diagnosis not present

## 2019-06-18 DIAGNOSIS — Z7952 Long term (current) use of systemic steroids: Secondary | ICD-10-CM | POA: Insufficient documentation

## 2019-06-18 DIAGNOSIS — K219 Gastro-esophageal reflux disease without esophagitis: Secondary | ICD-10-CM | POA: Diagnosis not present

## 2019-06-18 DIAGNOSIS — Z888 Allergy status to other drugs, medicaments and biological substances status: Secondary | ICD-10-CM | POA: Insufficient documentation

## 2019-06-18 LAB — SARS CORONAVIRUS 2 BY RT PCR (HOSPITAL ORDER, PERFORMED IN ~~LOC~~ HOSPITAL LAB): SARS Coronavirus 2: NEGATIVE

## 2019-06-18 LAB — COMPREHENSIVE METABOLIC PANEL
ALT: 8 U/L (ref 0–44)
AST: 15 U/L (ref 15–41)
Albumin: 4.2 g/dL (ref 3.5–5.0)
Alkaline Phosphatase: 72 U/L (ref 38–126)
Anion gap: 12 (ref 5–15)
BUN: 18 mg/dL (ref 8–23)
CO2: 22 mmol/L (ref 22–32)
Calcium: 8.9 mg/dL (ref 8.9–10.3)
Chloride: 108 mmol/L (ref 98–111)
Creatinine, Ser: 0.81 mg/dL (ref 0.44–1.00)
GFR calc Af Amer: >60 mL/min (ref >60)
GFR calc non Af Amer: >60 mL/min (ref >60)
Glucose, Bld: 162 mg/dL — ABNORMAL HIGH (ref 70–99)
Potassium: 3.8 mmol/L (ref 3.5–5.1)
Sodium: 142 mmol/L (ref 135–145)
Total Bilirubin: 0.5 mg/dL (ref 0.3–1.2)
Total Protein: 7.8 g/dL (ref 6.5–8.1)

## 2019-06-18 LAB — CBC WITH DIFFERENTIAL/PLATELET
Abs Immature Granulocytes: 0.04 10*3/uL (ref 0.00–0.07)
Basophils Absolute: 0.1 10*3/uL (ref 0.0–0.1)
Basophils Relative: 1 %
Eosinophils Absolute: 0.1 10*3/uL (ref 0.0–0.5)
Eosinophils Relative: 1 %
HCT: 24.6 % — ABNORMAL LOW (ref 36.0–46.0)
Hemoglobin: 5.8 g/dL — ABNORMAL LOW (ref 12.0–15.0)
Immature Granulocytes: 0 %
Lymphocytes Relative: 30 %
Lymphs Abs: 2.8 10*3/uL (ref 0.7–4.0)
MCH: 14.5 pg — ABNORMAL LOW (ref 26.0–34.0)
MCHC: 23.6 g/dL — ABNORMAL LOW (ref 30.0–36.0)
MCV: 61.3 fL — ABNORMAL LOW (ref 80.0–100.0)
Monocytes Absolute: 0.6 10*3/uL (ref 0.1–1.0)
Monocytes Relative: 6 %
Neutro Abs: 5.9 10*3/uL (ref 1.7–7.7)
Neutrophils Relative %: 62 %
Platelets: 304 10*3/uL (ref 150–400)
RBC: 4.01 MIL/uL (ref 3.87–5.11)
RDW: 22.7 % — ABNORMAL HIGH (ref 11.5–15.5)
WBC: 9.5 10*3/uL (ref 4.0–10.5)
nRBC: 0.2 % (ref 0.0–0.2)

## 2019-06-18 LAB — GLUCOSE, CAPILLARY: Glucose-Capillary: 139 mg/dL — ABNORMAL HIGH (ref 70–99)

## 2019-06-18 LAB — IRON AND TIBC
Iron: 11 ug/dL — ABNORMAL LOW (ref 28–170)
Saturation Ratios: 2 % — ABNORMAL LOW (ref 10.4–31.8)
TIBC: 479 ug/dL — ABNORMAL HIGH (ref 250–450)
UIBC: 468 ug/dL

## 2019-06-18 LAB — ABO/RH: ABO/RH(D): O NEG

## 2019-06-18 LAB — PREPARE RBC (CROSSMATCH)

## 2019-06-18 LAB — FERRITIN: Ferritin: 2 ng/mL — ABNORMAL LOW (ref 11–307)

## 2019-06-18 MED ORDER — PRAVASTATIN SODIUM 20 MG PO TABS: 10.0000 mg | ORAL_TABLET | Freq: Every day | ORAL | Status: DC

## 2019-06-18 MED ORDER — PANTOPRAZOLE SODIUM 40 MG IV SOLR
40.0000 mg | Freq: Once | INTRAVENOUS | Status: AC
  Administered 2019-06-18: 40 mg via INTRAVENOUS
  Filled 2019-06-18: qty 40

## 2019-06-18 MED ORDER — LOSARTAN POTASSIUM 50 MG PO TABS
100.0000 mg | ORAL_TABLET | Freq: Every day | ORAL | Status: DC
  Administered 2019-06-19: 09:00:00 100 mg via ORAL
  Filled 2019-06-18 (×2): qty 2

## 2019-06-18 MED ORDER — SODIUM CHLORIDE 0.9 % IV SOLN: 10.0000 mL/h | Freq: Once | INTRAVENOUS | Status: DC

## 2019-06-18 MED ORDER — INSULIN ASPART 100 UNIT/ML ~~LOC~~ SOLN
0.0000 [IU] | Freq: Three times a day (TID) | SUBCUTANEOUS | Status: DC
  Administered 2019-06-19: 2 [IU] via SUBCUTANEOUS
  Administered 2019-06-19: 1 [IU] via SUBCUTANEOUS
  Filled 2019-06-18 (×2): qty 1

## 2019-06-18 MED ORDER — LORATADINE 10 MG PO TABS: 10.0000 mg | ORAL_TABLET | Freq: Every day | ORAL | Status: DC | PRN

## 2019-06-18 MED ORDER — ALBUTEROL SULFATE (2.5 MG/3ML) 0.083% IN NEBU: 2.5000 mg | INHALATION_SOLUTION | RESPIRATORY_TRACT | Status: DC | PRN

## 2019-06-18 MED ORDER — BISACODYL 5 MG PO TBEC: 5.0000 mg | DELAYED_RELEASE_TABLET | Freq: Every day | ORAL | Status: DC | PRN

## 2019-06-18 MED ORDER — CLOPIDOGREL BISULFATE 75 MG PO TABS
75.0000 mg | ORAL_TABLET | Freq: Every day | ORAL | Status: DC
  Administered 2019-06-19: 75 mg via ORAL
  Filled 2019-06-18 (×2): qty 1

## 2019-06-18 MED ORDER — TRIAMTERENE-HCTZ 37.5-25 MG PO TABS
1.0000 | ORAL_TABLET | Freq: Every day | ORAL | Status: DC
  Administered 2019-06-19: 09:00:00 1 via ORAL
  Filled 2019-06-18 (×2): qty 1

## 2019-06-18 MED ORDER — DIAZEPAM 5 MG PO TABS: 5.0000 mg | ORAL_TABLET | ORAL | Status: DC | PRN

## 2019-06-18 MED ORDER — SODIUM CHLORIDE 0.9 % IV SOLN: 250.0000 mL | INTRAVENOUS | Status: DC | PRN

## 2019-06-18 MED ORDER — ONDANSETRON HCL 4 MG PO TABS: 4.0000 mg | ORAL_TABLET | Freq: Four times a day (QID) | ORAL | Status: DC | PRN

## 2019-06-18 MED ORDER — SENNOSIDES-DOCUSATE SODIUM 8.6-50 MG PO TABS: 1.0000 | ORAL_TABLET | Freq: Every evening | ORAL | Status: DC | PRN

## 2019-06-18 MED ORDER — INSULIN ASPART 100 UNIT/ML ~~LOC~~ SOLN: 0.0000 [IU] | Freq: Every day | SUBCUTANEOUS | Status: DC

## 2019-06-18 MED ORDER — ONDANSETRON HCL 4 MG/2ML IJ SOLN: 4.0000 mg | Freq: Four times a day (QID) | INTRAMUSCULAR | Status: DC | PRN

## 2019-06-18 MED ORDER — NIFEDIPINE ER 60 MG PO TB24
60.0000 mg | ORAL_TABLET | Freq: Every day | ORAL | Status: DC
  Administered 2019-06-19: 09:00:00 60 mg via ORAL
  Filled 2019-06-18 (×2): qty 1

## 2019-06-18 MED ORDER — ACETAMINOPHEN 650 MG RE SUPP: 650.0000 mg | Freq: Four times a day (QID) | RECTAL | Status: DC | PRN

## 2019-06-18 MED ORDER — SODIUM CHLORIDE 0.9% FLUSH
3.0000 mL | Freq: Two times a day (BID) | INTRAVENOUS | Status: DC
  Administered 2019-06-18: 22:00:00 3 mL via INTRAVENOUS

## 2019-06-18 MED ORDER — ACETAMINOPHEN 325 MG PO TABS: 650.0000 mg | ORAL_TABLET | Freq: Four times a day (QID) | ORAL | Status: DC | PRN

## 2019-06-18 MED ORDER — PANTOPRAZOLE SODIUM 40 MG PO TBEC
40.0000 mg | DELAYED_RELEASE_TABLET | Freq: Every day | ORAL | Status: DC
  Administered 2019-06-19: 09:00:00 40 mg via ORAL
  Filled 2019-06-18 (×2): qty 1

## 2019-06-18 MED ORDER — ENOXAPARIN SODIUM 40 MG/0.4ML ~~LOC~~ SOLN
40.0000 mg | SUBCUTANEOUS | Status: DC
  Administered 2019-06-18: 22:00:00 40 mg via SUBCUTANEOUS
  Filled 2019-06-18: qty 0.4

## 2019-06-18 MED ORDER — SODIUM CHLORIDE 0.9% FLUSH: 3.0000 mL | INTRAVENOUS | Status: DC | PRN

## 2019-06-18 MED ORDER — ALLOPURINOL 100 MG PO TABS
100.0000 mg | ORAL_TABLET | Freq: Every day | ORAL | Status: DC
  Administered 2019-06-19: 100 mg via ORAL
  Filled 2019-06-18 (×2): qty 1

## 2019-06-18 MED ORDER — PREDNISONE 2.5 MG PO TABS
12.5000 mg | ORAL_TABLET | Freq: Every day | ORAL | Status: DC
  Administered 2019-06-19: 12.5 mg via ORAL
  Filled 2019-06-18: qty 1

## 2019-06-18 MED ORDER — HYDROCODONE-ACETAMINOPHEN 5-325 MG PO TABS: 1.0000 | ORAL_TABLET | ORAL | Status: DC | PRN

## 2019-06-18 NOTE — H&P (Signed)
[facility] [place] [facility]   PATIENT NAME: Nancy Green    MR#:  [id number]  DATE OF BIRTH:  [birth year]  DATE OF ADMISSION:  06/18/2019  PRIMARY CARE PHYSICIAN: Nancy Green, Nancy Kathryn, NP   REQUESTING/REFERRING PHYSICIAN: [place].  CHIEF COMPLAINT:   Chief Complaint  Patient presents with  . Abnormal Lab   Low hemoglobin. HISTORY OF PRESENT ILLNESS:  Nancy Green  is a 85 y.o. female with a known history of multiple medical problems as below.  The patient is sent from clinic to ED due to low hemoglobin at 5.8 today.  She has had generalized weakness for the past 6 months.  But she denies any chest pain, shortness breath, melena, bloody stool or hematuria.  ED physician ordered PRBC transfusion and requested admission. PAST MEDICAL HISTORY:   Past Medical History:  Diagnosis Date  . Aneurysm (HCC) 1967   removed yrs ago - craniotomy  . Arthritis    fingers  . Diabetes mellitus without complication (HCC)   . GERD (gastroesophageal reflux disease)   . Gout   . Heart murmur    followed by PCP  . History of hiatal hernia    "repaired"  . Hypertension   . Legal blindness of left eye, as defined in [place]     PAST SURGICAL HISTORY:   Past Surgical History:  Procedure Laterality Date  . ABDOMINAL HYSTERECTOMY    . APPENDECTOMY    . CATARACT EXTRACTION W/PHACO Left 09/17/2015   Procedure: CATARACT EXTRACTION PHACO AND INTRAOCULAR LENS PLACEMENT (IOC);  Surgeon: Lockie Molahadwick Brasington, MD;  [facility];  Service: Ophthalmology;  Laterality: Left;  DIABETIC - insulin and oral meds  . CATARACT EXTRACTION W/PHACO Right 10/22/2015   Procedure: CATARACT EXTRACTION PHACO AND INTRAOCULAR LENS PLACEMENT (IOC);  Surgeon: Lockie Molahadwick Brasington, MD;  [facility];  Service: Ophthalmology;  Laterality: Right;  DIABETIC - insulin and oral meds  . CERVICAL DISC SURGERY    . CHOLECYSTECTOMY    . COLONOSCOPY    . CRANIOTOMY  1967   . ESOPHAGEAL DILATION  01/02/2018   Procedure: ESOPHAGEAL DILATION;  Surgeon: Midge MiniumWohl, Darren, MD;  [facility];  Service: Endoscopy;;  . ESOPHAGOGASTRODUODENOSCOPY (EGD) WITH PROPOFOL N/A 01/02/2018   Procedure: ESOPHAGOGASTRODUODENOSCOPY (EGD) WITH PROPOFOL;  Surgeon: Midge MiniumWohl, Darren, MD;  [facility];  Service: Endoscopy;  Laterality: N/A;  DIABETIC  . hiatel hernia     . RECTAL SURGERY      SOCIAL HISTORY:   Social History   Tobacco Use  . Smoking status: Former Games developermoker  . Smokeless tobacco: Never Used  . Tobacco comment: quit 20+ yrs ago  Substance Use Topics  . Alcohol use: No    FAMILY HISTORY:   Family History  Problem Relation Age of Onset  . Varicose Veins Neg Hx   . Vision loss Neg Hx     DRUG ALLERGIES:   Allergies  Allergen Reactions  . Neomycin Rash  . Zantac [Ranitidine Hcl] Hives       . Penicillin G Rash    REVIEW OF SYSTEMS:   Review of Systems  Constitutional: Positive for malaise/fatigue. Negative for chills and fever.  HENT: Negative for sore throat.   Eyes: Negative for blurred vision and double vision.  Respiratory: Negative for cough, hemoptysis, shortness of breath, wheezing and stridor.   Cardiovascular: Negative for chest pain, palpitations, orthopnea and leg swelling.  Gastrointestinal: Negative for abdominal pain, blood in stool, diarrhea, melena, nausea and  vomiting.  Genitourinary: Negative for dysuria, flank pain and hematuria.  Musculoskeletal: Negative for back pain and joint pain.  Skin: Negative for rash.  Neurological: Negative for dizziness, sensory change, focal weakness, seizures, loss of consciousness, weakness and headaches.  Endo/Heme/Allergies: Negative for polydipsia.  Psychiatric/Behavioral: Negative for depression. The patient is not nervous/anxious.     MEDICATIONS AT HOME:   Prior to Admission medications   Medication Sig Start Date End Date Taking? Authorizing Provider  albuterol  (PROVENTIL HFA;VENTOLIN HFA) 108 (90 BASE) MCG/ACT inhaler Inhale 2 puffs into the lungs every 4 (four) hours as needed for wheezing or shortness of breath. Patient not taking: Reported on 12/26/2017 05/28/15   Cuthriell, Charline Bills, PA-C  allopurinol (ZYLOPRIM) 100 MG tablet Take 100 mg by mouth daily.    [provider]  clopidogrel (PLAVIX) 75 MG tablet Take 75 mg by mouth daily.    [provider]  diazepam (VALIUM) 5 MG tablet Take 5 mg by mouth as needed for anxiety.    [provider]  insulin aspart (NOVOLOG FLEXPEN) 100 UNIT/ML FlexPen Inject into the skin 3 (three) times daily with meals.    [provider]  loratadine (CLARITIN) 10 MG tablet Take 10 mg by mouth daily as needed for allergies.    [provider]  losartan (COZAAR) 100 MG tablet Take 100 mg by mouth daily.    [provider]  metFORMIN (GLUCOPHAGE) 500 MG tablet Take by mouth at bedtime.    [provider]  NIFEdipine (PROCARDIA XL/ADALAT-CC) 60 MG 24 hr tablet Take 60 mg by mouth daily.    [provider]  omeprazole (PRILOSEC) 40 MG capsule Take 40 mg by mouth daily.    [provider]  pravastatin (PRAVACHOL) 10 MG tablet Take by mouth. 12/21/16 06/16/18  [provider]  predniSONE (DELTASONE) 10 MG tablet Take 12.5 mg by mouth daily with breakfast.     [provider]  triamterene-hydrochlorothiazide (DYAZIDE) 37.5-25 MG per capsule Take 1 capsule by mouth daily.     [provider]  Vitamin D, Ergocalciferol, (DRISDOL) 50000 units CAPS capsule TAKE 1 CAPSULE BY MOUTH EVERY 14 DAYS 03/30/17   [provider]      VITAL SIGNS:  Blood pressure (!) 166/63, pulse (!) 106, temperature 98.5 F (36.9 C), temperature source Oral, resp. rate 14, height 5' (1.524 m), weight 48.1 kg, SpO2 99 %.  PHYSICAL EXAMINATION:  Physical Exam  GENERAL:  85 y.o.-year-old patient lying in the bed with no acute distress.   EYES: Pupils equal, round, reactive to light and accommodation. No scleral icterus. Extraocular muscles intact.  Pale conjunctiva. HEENT: Head atraumatic, normocephalic. NECK:  Supple, no jugular venous distention. No thyroid enlargement, no tenderness.  LUNGS: Normal breath sounds bilaterally, no wheezing, rales,rhonchi or crepitation. No use of accessory muscles of respiration.  CARDIOVASCULAR: S1, S2 normal. No murmurs, rubs, or gallops.  ABDOMEN: Soft, nontender, nondistended. Bowel sounds present. No organomegaly or mass.  EXTREMITIES: No pedal edema, cyanosis, or clubbing.  NEUROLOGIC: Cranial nerves II through XII are intact. Muscle strength 5/5 in all extremities. Sensation intact. Gait not checked.  PSYCHIATRIC: The patient is alert and oriented x 3.  SKIN: No obvious rash, lesion, or ulcer.   LABORATORY PANEL:   CBC Recent Labs  Lab 06/18/19 1654  WBC 9.5  HGB 5.8*  HCT 24.6*  PLT 304   ------------------------------------------------------------------------------------------------------------------  Chemistries  Recent Labs  Lab 06/18/19 1654  NA 142  K 3.8  CL  108  CO2 22  GLUCOSE 162*  BUN 18  CREATININE 0.81  CALCIUM 8.9  AST 15  ALT 8  ALKPHOS 72  BILITOT 0.5   ------------------------------------------------------------------------------------------------------------------  Cardiac Enzymes No results for input(s): TROPONINI in the last 168 hours. ------------------------------------------------------------------------------------------------------------------  RADIOLOGY:  No results found.    IMPRESSION AND PLAN:   Anemia  The patient will be placed on observation. PRBC transfusion 2 unit, follow-up anemia work-up and hemoglobin in a.m. Hypertension.  Continue hypertension medication. Diabetes.  Start sliding scale. GERD.  Protonix.  All the records are reviewed and case discussed with ED provider. Management plans discussed with the  patient, daughter and they are in agreement.  CODE STATUS: Full code.  TOTAL TIME TAKING CARE OF THIS PATIENT: 48 minutes.    Shaune Pollack M.D on 06/18/2019 at 7:26 PM  Between 7am to 6pm - Pager - [phone]  After 6pm go to [email or web address] - Social research officer, government  [facility]  Office  [phone]  CC: Primary care physician; Nancy Buddy, NP   Note: This dictation was prepared with Dragon dictation along with smaller phrase technology. Any transcriptional errors that result from this process are unin

## 2019-06-18 NOTE — ED Notes (Addendum)
Pt up to bedside toilet to urinate. Steady. Will send urine sample to lab in case desired later.

## 2019-06-18 NOTE — ED Notes (Signed)
This RN attempted to call report at this time.  

## 2019-06-18 NOTE — ED Provider Notes (Signed)
[facility] Emergency Department Provider Note  ____________________________________________   First MD Initiated Contact with Patient 06/18/19 1805     (approximate)  I have reviewed the triage vital signs and the nursing notes.  History  Chief Complaint Abnormal Lab    HPI Nancy Green is a 85 y.o. female with past medical history as below, including diabetes, thyroid disease, hypertension, hyperlipidemia, temporal arteritis on daily prednisone, who presents for an abnormal lab.  Patient was being seen as an outpatient in the endocrinology clinic for follow-up of her diabetes.  In the clinic she reported that she was craving ice recently. Concern for pica, therefore basic labs were obtained.  She was called and told that she was anemic, and therefore needed to present to the emergency department for further evaluation.  She does report feeling generally fatigued, but states this has been going off and on for at least several weeks to months.  She denies any hematuria, hematochezia, or melena.  As noted above, she is on prednisone daily, but denies any other heavy NSAID use.  She denies any regular alcohol consumption.  She estimates that her last colonoscopy was probably 8 years ago.   Past Medical Hx Past Medical History:  Diagnosis Date  . Aneurysm (HCC) 1967   removed yrs ago - craniotomy  . Arthritis    fingers  . Diabetes mellitus without complication (HCC)   . GERD (gastroesophageal reflux disease)   . Gout   . Heart murmur    followed by PCP  . History of hiatal hernia    "repaired"  . Hypertension   . Legal blindness of left eye, as defined in [place]     Problem List Patient Active Problem List   Diagnosis Date Noted  . Problems with swallowing and mastication   . Stricture and stenosis of esophagus   . Leg pain 02/25/2017  . PAD (peripheral artery disease) (HCC) 02/25/2017  . Hyperlipidemia 02/10/2017  . Diabetes (HCC)  08/31/2016  . Dizziness and giddiness 08/31/2016  . Carotid stenosis 08/31/2016  . Temporal arteritis (HCC) 04/21/2015  . IDA (iron deficiency anemia) 12/26/2014  . Microalbuminuria 02/14/2014  . Vitamin D deficiency, unspecified 02/14/2014  . Abnormal weight loss 12/17/2013  . Poor balance 12/17/2013  . Weakness 12/17/2013  . Thrombocytopenia (HCC) 11/25/2013  . Essential hypertension 06/09/2012  . Allergic rhinitis, seasonal 06/29/2011  . Anxiety 06/29/2011  . GERD (gastroesophageal reflux disease) 06/29/2011  . Gout attack 06/29/2011  . History of stroke 06/29/2011  . Mitral insufficiency 06/29/2011  . Nodule of right lung 06/29/2011  . Osteoporosis, post-menopausal 06/29/2011    Past Surgical Hx Past Surgical History:  Procedure Laterality Date  . ABDOMINAL HYSTERECTOMY    . APPENDECTOMY    . CATARACT EXTRACTION W/PHACO Left 09/17/2015   Procedure: CATARACT EXTRACTION PHACO AND INTRAOCULAR LENS PLACEMENT (IOC);  Surgeon: Lockie Molahadwick Brasington, MD;  [facility];  Service: Ophthalmology;  Laterality: Left;  DIABETIC - insulin and oral meds  . CATARACT EXTRACTION W/PHACO Right 10/22/2015   Procedure: CATARACT EXTRACTION PHACO AND INTRAOCULAR LENS PLACEMENT (IOC);  Surgeon: Lockie Molahadwick Brasington, MD;  [facility];  Service: Ophthalmology;  Laterality: Right;  DIABETIC - insulin and oral meds  . CERVICAL DISC SURGERY    . CHOLECYSTECTOMY    . COLONOSCOPY    . CRANIOTOMY  1967  . ESOPHAGEAL DILATION  01/02/2018   Procedure: ESOPHAGEAL DILATION;  Surgeon: Midge MiniumWohl, Darren, MD;  [facility];  Service: Endoscopy;;  .  ESOPHAGOGASTRODUODENOSCOPY (EGD) WITH PROPOFOL N/A 01/02/2018   Procedure: ESOPHAGOGASTRODUODENOSCOPY (EGD) WITH PROPOFOL;  Surgeon: Lucilla Lame, MD;  Location: [place];  Service: Endoscopy;  Laterality: N/A;  DIABETIC  . hiatel hernia     . RECTAL SURGERY      Medications Prior to Admission medications    Medication Sig Start Date End Date Taking? Authorizing Provider  albuterol (PROVENTIL HFA;VENTOLIN HFA) 108 (90 BASE) MCG/ACT inhaler Inhale 2 puffs into the lungs every 4 (four) hours as needed for wheezing or shortness of breath. Patient not taking: Reported on 12/26/2017 05/28/15   Cuthriell, Charline Bills, PA-C  allopurinol (ZYLOPRIM) 100 MG tablet Take 100 mg by mouth daily.    [provider]  clopidogrel (PLAVIX) 75 MG tablet Take 75 mg by mouth daily.    [provider]  diazepam (VALIUM) 5 MG tablet Take 5 mg by mouth as needed for anxiety.    [provider]  insulin aspart (NOVOLOG FLEXPEN) 100 UNIT/ML FlexPen Inject into the skin 3 (three) times daily with meals.    [provider]  loratadine (CLARITIN) 10 MG tablet Take 10 mg by mouth daily as needed for allergies.    [provider]  losartan (COZAAR) 100 MG tablet Take 100 mg by mouth daily.    [provider]  metFORMIN (GLUCOPHAGE) 500 MG tablet Take by mouth at bedtime.    [provider]  NIFEdipine (PROCARDIA XL/ADALAT-CC) 60 MG 24 hr tablet Take 60 mg by mouth daily.    [provider]  omeprazole (PRILOSEC) 40 MG capsule Take 40 mg by mouth daily.    [provider]  pravastatin (PRAVACHOL) 10 MG tablet Take by mouth. 12/21/16 06/16/18  [provider]  predniSONE (DELTASONE) 10 MG tablet Take 12.5 mg by mouth daily with breakfast.     [provider]  triamterene-hydrochlorothiazide (DYAZIDE) 37.5-25 MG per capsule Take 1 capsule by mouth daily.     [provider]  Vitamin D, Ergocalciferol, (DRISDOL) 50000 units CAPS capsule TAKE 1 CAPSULE BY MOUTH EVERY 14 DAYS 03/30/17   [provider]    Allergies Neomycin, Zantac [ranitidine hcl], and Penicillin g  Family Hx Family History  Problem Relation Age of Onset  . Varicose Veins Neg Hx   . Vision loss Neg Hx     Social Hx Social History   Tobacco Use  .  Smoking status: Former Research scientist (life sciences)  . Smokeless tobacco: Never Used  . Tobacco comment: quit 20+ yrs ago  Substance Use Topics  . Alcohol use: No  . Drug use: Never     Review of Systems  Constitutional: Negative for fever, chills. + Generalized weakness/fatigue Eyes: Negative for visual changes. ENT: Negative for sore throat. Cardiovascular: Negative for chest pain. Respiratory: Negative for shortness of breath. Gastrointestinal: Negative for nausea, vomiting.  Genitourinary: Negative for dysuria. Musculoskeletal: Negative for leg swelling. Skin: Negative for rash. Neurological: Negative for for headaches.   Physical Exam  Vital Signs: ED Triage Vitals  Enc Vitals Group     BP 06/18/19 1652 (!) 156/56     Pulse Rate 06/18/19 1652 (!) 103     Resp 06/18/19 1652 16     Temp 06/18/19 1652 98.5 F (36.9 C)     Temp Source 06/18/19 1652 Oral     SpO2 06/18/19 1652 98 %     Weight 06/18/19 1646 106 lb (48.1 kg)     Height 06/18/19 1646 5' (1.524 m)     Head  Circumference --      Peak Flow --      Pain Score 06/18/19 1646 8     Pain Loc --      Pain Edu? --      Excl. in GC? --     Constitutional: Alert and oriented.  Head: Normocephalic. Atraumatic. Eyes: Conjunctivae are pale. Nose: No congestion. No rhinorrhea. Mouth/Throat: Mucous membranes are moist.  Neck: No stridor.   Cardiovascular: Normal rate, regular rhythm. Extremities well perfused. Respiratory: Normal respiratory effort.   Gastrointestinal: Soft. Non-tender. Non-distended.  Rectal: RN chaperone present.  Brown stool.  Guaiac negative. Musculoskeletal: No lower extremity edema. No deformities. Neurologic:  Normal speech and language. No gross focal neurologic deficits are appreciated.  Skin: Skin is warm, dry and intact. No rash noted. Psychiatric: Mood and affect are appropriate for situation.  EKG  N/A    Radiology  N/A   Procedures  Procedure(s) performed (including critical care):   .Critical Care Performed by: Miguel Aschoff., MD Authorized by: Miguel Aschoff., MD   Critical care provider statement:    Critical care time (minutes):  30   Critical care was necessary to treat or prevent imminent or life-threatening deterioration of the following conditions: anemia req transfusion.   Critical care was time spent personally by me on the following activities:  Discussions with consultants, evaluation of patient's response to treatment, examination of patient, ordering and performing treatments and interventions, ordering and review of laboratory studies, ordering and review of radiographic studies, pulse oximetry, re-evaluation of patient's condition, obtaining history from patient or surrogate and review of old charts     Initial Impression / Assessment and Plan / ED Course  84 y.o. female who presents to the ED for anemia, noted on outpatient blood work today.  Ddx: occult GI bleed, malignancy, vitamin/iron deficiency  Plan: labs, transfuse as needed  Labs confirm hemoglobin 5.8, will transfuse, patient consented. Iron studies added on. Her rectal here is guaiac negative, though would still be concerned for occult intermittent acute to subacute GIB as source given she is on prednisone chronically, will give IV PPI and admit for further work up. Patient agreeable.  Final Clinical Impression(s) / ED Diagnosis  Final diagnoses:  Symptomatic anemia       Note:  This document was prepared using Dragon voice recognition software and may include unintentional dictation errors.   Miguel Aschoff., MD 06/19/19 [phone]

## 2019-06-18 NOTE — ED Notes (Signed)
.. ED TO INPATIENT HANDOFF REPORT  ED Nurse Name and Phone #: Deneise Lever [phone]  R Name/Age/Gender Nancy Green 85 y.o. female Room/Bed: ED02A/ED02A  Code Status   Code Status: Not on file  Home/SNF/Other Home Patient oriented to: self, place, time and situation Is this baseline? Yes   Triage Complete: Triage complete  Chief Complaint Abn Labs  Triage Note Patient sent by her diabetes doctor for low hemoglobin    Allergies Allergies  Allergen Reactions  . Neomycin Rash  . Zantac [Ranitidine Hcl] Hives       . Penicillin G Rash    Level of Care/Admitting Diagnosis ED Disposition    ED Disposition Condition [facility] Area: [place] [100120]  Level of Care: Med-Surg [16]  Covid Evaluation: Asymptomatic Screening Protocol (No Symptoms)  Diagnosis: Anemia [518841]  Admitting Physician: Demetrios Loll [[id number]]  Attending Physician: Demetrios Loll [phone]  PT Class (Do Not Modify): Observation [104]  PT Acc Code (Do Not Modify): Observation [10022]       B Medical/Surgery History Past Medical History:  Diagnosis Date  . Aneurysm ([place]) 1967   removed yrs ago - craniotomy  . Arthritis    fingers  . Diabetes mellitus without complication ([place])   . GERD (gastroesophageal reflux disease)   . Gout   . Heart murmur    followed by PCP  . History of hiatal hernia    "repaired"  . Hypertension   . Legal blindness of left eye, as defined in [place]    Past Surgical History:  Procedure Laterality Date  . ABDOMINAL HYSTERECTOMY    . APPENDECTOMY    . CATARACT EXTRACTION W/PHACO Left 09/17/2015   Procedure: CATARACT EXTRACTION PHACO AND INTRAOCULAR LENS PLACEMENT (IOC);  Surgeon: Leandrew Koyanagi, MD;  Location: [place];  Service: Ophthalmology;  Laterality: Left;  DIABETIC - insulin and oral meds  . CATARACT EXTRACTION W/PHACO Right 10/22/2015   Procedure: CATARACT EXTRACTION PHACO AND INTRAOCULAR LENS PLACEMENT  (IOC);  Surgeon: Leandrew Koyanagi, MD;  Location: [place];  Service: Ophthalmology;  Laterality: Right;  DIABETIC - insulin and oral meds  . CERVICAL DISC SURGERY    . CHOLECYSTECTOMY    . COLONOSCOPY    . CRANIOTOMY  1967  . ESOPHAGEAL DILATION  01/02/2018   Procedure: ESOPHAGEAL DILATION;  Surgeon: Lucilla Lame, MD;  Location: [place];  Service: Endoscopy;;  . ESOPHAGOGASTRODUODENOSCOPY (EGD) WITH PROPOFOL N/A 01/02/2018   Procedure: ESOPHAGOGASTRODUODENOSCOPY (EGD) WITH PROPOFOL;  Surgeon: Lucilla Lame, MD;  Location: [place];  Service: Endoscopy;  Laterality: N/A;  DIABETIC  . hiatel hernia     . RECTAL SURGERY       A IV Location/Drains/Wounds Patient Lines/Drains/Airways Status   Active Line/Drains/Airways    Name:   Placement date:   Placement time:   Site:   Days:   Incision (Closed) 09/17/15 Eye Left   09/17/15    0744     1370   Incision (Closed) 10/22/15 Eye Right   10/22/15    0711     1335   Incision (Closed) 01/02/18 Lip   01/02/18    1049     532          Intake/Output Last 24 hours No intake or output data in the 24 hours ending 06/18/19 1940  Labs/Imaging Results for orders placed or performed during the hospital encounter of 06/18/19 (from the past 48 hour(s))  CBC with Differential  Status: Abnormal   Collection Time: 06/18/19  4:54 PM  Result Value Ref Range   WBC 9.5 4.0 - 10.5 K/uL   RBC 4.01 3.87 - 5.11 MIL/uL   Hemoglobin 5.8 (L) 12.0 - 15.0 g/dL    Comment: Reticulocyte Hemoglobin testing may be clinically indicated, consider ordering this additional test [id number]    HCT 24.6 (L) 36.0 - 46.0 %   MCV 61.3 (L) 80.0 - 100.0 fL   MCH 14.5 (L) 26.0 - 34.0 pg   MCHC 23.6 (L) 30.0 - 36.0 g/dL   RDW 24.4 (H) 62.8 - 63.8 %   Platelets 304 150 - 400 K/uL   nRBC 0.2 0.0 - 0.2 %   Neutrophils Relative % 62 %   Neutro Abs 5.9 1.7 - 7.7 K/uL   Lymphocytes Relative 30 %   Lymphs Abs 2.8 0.7 - 4.0 K/uL   Monocytes  Relative 6 %   Monocytes Absolute 0.6 0.1 - 1.0 K/uL   Eosinophils Relative 1 %   Eosinophils Absolute 0.1 0.0 - 0.5 K/uL   Basophils Relative 1 %   Basophils Absolute 0.1 0.0 - 0.1 K/uL   Immature Granulocytes 0 %   Abs Immature Granulocytes 0.04 0.00 - 0.07 K/uL   Bite Cells PRESENT    Polychromasia PRESENT    Target Cells PRESENT    Spherocytes PRESENT     Comment: Performed at [facility], [address] [place], [place] [address]  Comprehensive metabolic panel     Status: Abnormal   Collection Time: 06/18/19  4:54 PM  Result Value Ref Range   Sodium 142 135 - 145 mmol/L   Potassium 3.8 3.5 - 5.1 mmol/L   Chloride 108 98 - 111 mmol/L   CO2 22 22 - 32 mmol/L   Glucose, Bld 162 (H) 70 - 99 mg/dL   BUN 18 8 - 23 mg/dL   Creatinine, Ser 6.57 0.44 - 1.00 mg/dL   Calcium 8.9 8.9 - 90.3 mg/dL   Total Protein 7.8 6.5 - 8.1 g/dL   Albumin 4.2 3.5 - 5.0 g/dL   AST 15 15 - 41 U/L   ALT 8 0 - 44 U/L   Alkaline Phosphatase 72 38 - 126 U/L   Total Bilirubin 0.5 0.3 - 1.2 mg/dL   GFR calc non Af Amer >60 >60 mL/min   GFR calc Af Amer >60 >60 mL/min   Anion gap 12 5 - 15    Comment: Performed at [facility], [address] [place], [place] [address]  Type and screen [facility]     Status: None   Collection Time: 06/18/19  4:54 PM  Result Value Ref Range   ABO/RH(D) O NEG    Antibody Screen NEG    Sample Expiration      06/21/2019,2359 Performed at [facility], [address] [place], [place] [address]   Prepare RBC     Status: None (Preliminary result)   Collection Time: 06/18/19  6:54 PM  Result Value Ref Range   Order Confirmation PENDING    No results found.  Pending Labs Unresulted Labs (From admission, onward)    Start     Ordered   06/18/19 1917  ABO/Rh  Once,   STAT     06/18/19 1917   06/18/19 1910  SARS Coronavirus 2 by RT PCR (hospital order, performed in [facility] hospital lab) Nasopharyngeal  Nasopharyngeal Swab  (Symptomatic/High Risk of Exposure/Tier 1 Patients Labs with Precautions)  ONCE -  STAT,   STAT    Question Answer Comment  Is this test for diagnosis or screening Screening   Symptomatic for COVID-19 as defined by CDC No   Hospitalized for COVID-19 No   Admitted to ICU for COVID-19 No   Previously tested for COVID-19 No   Resident in a congregate (group) care setting No   Employed in healthcare setting No   Pregnant No      06/18/19 1909   06/18/19 1854  Ferritin (Iron Binding Protein)  ONCE - STAT,   STAT     06/18/19 1853   06/18/19 1854  Iron and TIBC  ONCE - STAT,   STAT     06/18/19 1853   06/18/19 1654  Pathologist smear review  Once,   STAT     06/18/19 1654   Signed and Held  Creatinine, serum  (enoxaparin (LOVENOX)    CrCl >/= 30 ml/min)  Weekly,   R    Comments: while on enoxaparin therapy    Signed and Held   Signed and Held  Hemoglobin  Tomorrow morning,   R     Signed and Held   Signed and Held  Hemoglobin A1c  Add-on,   R    Comments: To assess prior glycemic control    Signed and Held          Vitals/Pain Today's Vitals   06/18/19 1809 06/18/19 1810 06/18/19 1811 06/18/19 1812  BP: (!) 166/63     Pulse:  (!) 108 (!) 110 (!) 106  Resp:   14   Temp:      TempSrc:      SpO2:  100% 99% 99%  Weight:      Height:      PainSc:        Isolation Precautions No active isolations  Medications Medications  0.9 %  sodium chloride infusion (has no administration in time range)  pantoprazole (PROTONIX) injection 40 mg (40 mg Intravenous Given 06/18/19 1932)    Mobility walks with device Low fall risk   Focused Assessments Abnormal lab   R Recommendations: See Admitting Provider Note  Report given to:   Additional Notes:

## 2019-06-18 NOTE — ED Triage Notes (Signed)
Patient sent by her diabetes doctor for low hemoglobin

## 2019-06-18 NOTE — ED Notes (Signed)
Tori from lab verified that blood was not ready at this time. Lab stated they will call RN when blood is available for pick up.

## 2019-06-18 NOTE — ED Notes (Signed)
Pt given warm blankets.

## 2019-06-19 DIAGNOSIS — D509 Iron deficiency anemia, unspecified: Secondary | ICD-10-CM | POA: Diagnosis not present

## 2019-06-19 LAB — GLUCOSE, CAPILLARY
Glucose-Capillary: 122 mg/dL — ABNORMAL HIGH (ref 70–99)
Glucose-Capillary: 215 mg/dL — ABNORMAL HIGH (ref 70–99)

## 2019-06-19 LAB — HEMOGLOBIN: Hemoglobin: 9.2 g/dL — ABNORMAL LOW (ref 12.0–15.0)

## 2019-06-19 LAB — PATHOLOGIST SMEAR REVIEW

## 2019-06-19 MED ORDER — SODIUM CHLORIDE 0.9 % IV SOLN
200.0000 mg | Freq: Once | INTRAVENOUS | Status: AC
  Administered 2019-06-19: 10:00:00 200 mg via INTRAVENOUS
  Filled 2019-06-19: qty 10

## 2019-06-19 MED ORDER — LABETALOL HCL 5 MG/ML IV SOLN
5.0000 mg | INTRAVENOUS | Status: DC | PRN
  Administered 2019-06-19: 04:00:00 5 mg via INTRAVENOUS
  Administered 2019-06-19: 10 mg via INTRAVENOUS
  Filled 2019-06-19 (×2): qty 4

## 2019-06-19 MED ORDER — HYDRALAZINE HCL 50 MG PO TABS
100.0000 mg | ORAL_TABLET | Freq: Once | ORAL | Status: AC
  Administered 2019-06-19: 100 mg via ORAL
  Filled 2019-06-19: qty 2

## 2019-06-19 NOTE — Discharge Summary (Signed)
[facility] - [place] [facility]  Nancy Green, 85 y.o., DOB [birth year], MRN [id number]. Admission date: 06/18/2019 Discharge Date 06/19/2019 Primary MD Gauger, Hermenia FiscalSarah Kathryn, NP Admitting Physician Shaune PollackQing Chen, MD  Admission Diagnosis  Symptomatic anemia [D64.9]  Discharge Diagnosis   Active Problems: Iron deficiency anemia severe Essential hypertension GERD Diabetes type 2      Hospital Course Nancy Green  is a 85 y.o. female with a known history of multiple medical problems as below.  The patient is sent from clinic to ED due to low hemoglobin at 5.8 today.  She has had generalized weakness for the past 6 months.  Patient denied any melena or any blood in her stool.  She received 2 units of packed RBCs hemoglobin is stable.  Her iron is severely low so she will receive IV iron.  Patient is feeling much better and is stable for discharge.  Spoke to GI they will see her as outpatient.  For EGD and a colonoscopy.           Consults  None  Significant Tests:  See full reports for all details    No results found.     Today   Subjective:   Nancy Green patient feeling well denies any complaints  Objective:   Blood pressure (!) 190/68, pulse 91, temperature 97.9 F (36.6 C), temperature source Oral, resp. rate 18, height 5' (1.524 m), weight 47 kg, SpO2 94 %.  .  Intake/Output Summary (Last 24 hours) at 06/19/2019 1058 Last data filed at 06/19/2019 0900 Gross per 24 hour  Intake 970.83 ml  Output -  Net 970.83 ml    Exam VITAL SIGNS: Blood pressure (!) 190/68, pulse 91, temperature 97.9 F (36.6 C), temperature source Oral, resp. rate 18, height 5' (1.524 m), weight 47 kg, SpO2 94 %.  GENERAL:  85 y.o.-year-old patient lying in the bed with no acute distress.  EYES: Pupils equal, round, reactive to light and accommodation. No scleral icterus. Extraocular muscles intact.  HEENT: Head atraumatic, normocephalic. Oropharynx and  nasopharynx clear.  NECK:  Supple, no jugular venous distention. No thyroid enlargement, no tenderness.  LUNGS: Normal breath sounds bilaterally, no wheezing, rales,rhonchi or crepitation. No use of accessory muscles of respiration.  CARDIOVASCULAR: S1, S2 normal. No murmurs, rubs, or gallops.  ABDOMEN: Soft, nontender, nondistended. Bowel sounds present. No organomegaly or mass.  EXTREMITIES: No pedal edema, cyanosis, or clubbing.  NEUROLOGIC: Cranial nerves II through XII are intact. Muscle strength 5/5 in all extremities. Sensation intact. Gait not checked.  PSYCHIATRIC: The patient is alert and oriented x 3.  SKIN: No obvious rash, lesion, or ulcer.   Data Review     CBC w Diff:  Lab Results  Component Value Date   WBC 9.5 06/18/2019   HGB 9.2 (L) 06/19/2019   HGB 12.0 07/11/2014   HCT 24.6 (L) 06/18/2019   HCT 38.3 07/11/2014   PLT 304 06/18/2019   PLT 371 07/11/2014   LYMPHOPCT 30 06/18/2019   LYMPHOPCT 24.6 07/11/2014   MONOPCT 6 06/18/2019   MONOPCT 5.0 07/11/2014   EOSPCT 1 06/18/2019   EOSPCT 2.5 07/11/2014   BASOPCT 1 06/18/2019   BASOPCT 1.2 07/11/2014   CMP:  Lab Results  Component Value Date   NA 142 06/18/2019   NA 135 (L) 11/27/2013   K 3.8 06/18/2019   K 3.4 (L) 11/27/2013   CL 108 06/18/2019   CL 102 11/27/2013   CO2 22 06/18/2019   CO2 29  11/27/2013   BUN 18 06/18/2019   BUN 13 11/27/2013   CREATININE 0.81 06/18/2019   CREATININE 0.81 11/27/2013   PROT 7.8 06/18/2019   PROT 7.9 11/26/2013   ALBUMIN 4.2 06/18/2019   ALBUMIN 3.0 (L) 11/26/2013   BILITOT 0.5 06/18/2019   BILITOT 0.4 11/26/2013   ALKPHOS 72 06/18/2019   ALKPHOS 99 11/26/2013   AST 15 06/18/2019   AST 13 (L) 11/26/2013   ALT 8 06/18/2019   ALT 18 11/26/2013  .  Micro Results Recent Results (from the past 240 hour(s))  SARS Coronavirus 2 by RT PCR (hospital order, performed in [facility] hospital lab) Nasopharyngeal Nasopharyngeal Swab     Status: None   Collection Time:  06/18/19  7:17 PM   Specimen: Nasopharyngeal Swab  Result Value Ref Range Status   SARS Coronavirus 2 NEGATIVE NEGATIVE Final    Comment: (NOTE) If result is NEGATIVE SARS-CoV-2 target nucleic acids are NOT DETECTED. The SARS-CoV-2 RNA is generally detectable in upper and lower  respiratory specimens during the acute phase of infection. The lowest  concentration of SARS-CoV-2 viral copies this assay can detect is 250  copies / mL. A negative result does not preclude SARS-CoV-2 infection  and should not be used as the sole basis for treatment or other  patient management decisions.  A negative result may occur with  improper specimen collection / handling, submission of specimen other  than nasopharyngeal swab, presence of viral mutation(s) within the  areas targeted by this assay, and inadequate number of viral copies  (<250 copies / mL). A negative result must be combined with clinical  observations, patient history, and epidemiological information. If result is POSITIVE SARS-CoV-2 target nucleic acids are DETECTED. The SARS-CoV-2 RNA is generally detectable in upper and lower  respiratory specimens dur ing the acute phase of infection.  Positive  results are indicative of active infection with SARS-CoV-2.  Clinical  correlation with patient history and other diagnostic information is  necessary to determine patient infection status.  Positive results do  not rule out bacterial infection or co-infection with other viruses. If result is PRESUMPTIVE POSTIVE SARS-CoV-2 nucleic acids MAY BE PRESENT.   A presumptive positive result was obtained on the submitted specimen  and confirmed on repeat testing.  While 2019 novel coronavirus  (SARS-CoV-2) nucleic acids may be present in the submitted sample  additional confirmatory testing may be necessary for epidemiological  and / or clinical management purposes  to differentiate between  SARS-CoV-2 and other Sarbecovirus currently known to  infect humans.  If clinically indicated additional testing with an alternate test  methodology [phone]) is advised. The SARS-CoV-2 RNA is generally  detectable in upper and lower respiratory sp ecimens during the acute  phase of infection. The expected result is Negative. Fact Sheet for Patients:  StrictlyIdeas.no Fact Sheet for Healthcare Providers: BankingDealers.co.za This test is not yet approved or cleared by the [place] FDA and has been authorized for detection and/or diagnosis of SARS-CoV-2 by FDA under an Emergency Use Authorization (EUA).  This EUA will remain in effect (meaning this test can be used) for the duration of the COVID-19 declaration under Section 564(b)(1) of the Act, 21 U.S.C. section 360bbb-3(b)(1), unless the authorization is terminated or revoked sooner. Performed at [facility], [address] [place], [place] [address]         Code Status Orders  (From admission, onward)         Start     Ordered  06/18/19 2020  Full code  Continuous     06/18/19 2019        Code Status History    This patient has a current code status but no historical code status.   Advance Care Planning Activity    Advance Directive Documentation     Most Recent Value  Type of Advance Directive  Living will  Pre-existing out of facility DNR order (yellow form or pink MOST form)  -  "MOST" Form in Place?  -          Follow-up Information    Gauger, Hermenia Fiscal, NP Follow up in 6 day(s).   Specialty: Internal Medicine Contact information: [address] [facility] [place] [address] [phone]        Midge Minium, MD Follow up in 2 week(s).   Specialty: Gastroenterology Why: for iron def anemia Contact information: [address] [place] [place]  [place] [address] [phone]           Discharge Medications   Allergies as of 06/19/2019      Reactions    Neomycin Rash   Zantac [ranitidine Hcl] Hives       Penicillin G Rash      Medication List    TAKE these medications   albuterol 108 (90 Base) MCG/ACT inhaler Commonly known as: VENTOLIN HFA Inhale 2 puffs into the lungs every 4 (four) hours as needed for wheezing or shortness of breath.   allopurinol 100 MG tablet Commonly known as: ZYLOPRIM Take 100 mg by mouth daily.   clopidogrel 75 MG tablet Commonly known as: PLAVIX Take 75 mg by mouth daily.   diazepam 5 MG tablet Commonly known as: VALIUM Take 5 mg by mouth as needed for anxiety.   loratadine 10 MG tablet Commonly known as: CLARITIN Take 10 mg by mouth daily as needed for allergies.   losartan 100 MG tablet Commonly known as: COZAAR Take 100 mg by mouth daily.   metFORMIN 500 MG tablet Commonly known as: GLUCOPHAGE Take by mouth at bedtime.   NIFEdipine 60 MG 24 hr tablet Commonly known as: PROCARDIA XL/NIFEDICAL XL Take 60 mg by mouth daily.   NovoLOG FlexPen 100 UNIT/ML FlexPen Generic drug: insulin aspart Inject into the skin 3 (three) times daily with meals.   omeprazole 40 MG capsule Commonly known as: PRILOSEC Take 40 mg by mouth daily.   pravastatin 10 MG tablet Commonly known as: PRAVACHOL Take by mouth.   predniSONE 10 MG tablet Commonly known as: DELTASONE Take 12.5 mg by mouth daily with breakfast.   triamterene-hydrochlorothiazide 37.5-25 MG capsule Commonly known as: DYAZIDE Take 1 capsule by mouth daily.   Vitamin D (Ergocalciferol) 1.25 MG (50000 UT) Caps capsule Commonly known as: DRISDOL TAKE 1 CAPSULE BY MOUTH EVERY 14 DAYS          Total Time in preparing paper work, data evaluation and todays exam - 35 minutes  Auburn Bilberry M.D on 06/19/2019 at 10:58 AM [facility]   Office  [phone]

## 2019-06-19 NOTE — Progress Notes (Signed)
Patient discharged with daughter via personal vehicle without incidence.

## 2019-06-19 NOTE — Progress Notes (Signed)
Patient waiting in room for daughter , has been waiting for several hours. States her daughter had a doctor appt. This Probation officer did call daughter and left a voicemail on her phone to call back to 1A.. VS obtained.

## 2019-06-19 NOTE — TOC Transition Note (Signed)
Transition of Care [facility]) - CM/SW Discharge Note   Patient Details  Name: Nancy Green MRN: [id number] Date of Birth: 1929-10-28  Transition of Care [facility]) CM/SW Contact:  Jaceon Heiberger, Lenice Llamas Phone Number: [phone]  06/19/2019, 2:30 PM   Clinical Narrative: Clinical Social Worker (CSW) met with patient alone to discuss D/C plan. Patient was alert and oriented X4 and was laying in the bed. CSW introduced self and explained role of CSW department. Per patient she lives alone in [place] and still drives. Patient reported that she has a walker at home and her PCP is through [facility]. Patient reported that she has 4 adult children and her daughter Velva Harman is the only 1 that lives in [place] and is close by. Patient reported that she plans to go home today and reported no needs or concerns. Please reconsult if future social work needs arise. CSW signing off.       Final next level of care: Home/Self Care Barriers to Discharge: Barriers Resolved   Patient Goals and CMS Choice Patient states their goals for this hospitalization and ongoing recovery are:: To go home.      Discharge Placement                       Discharge Plan and Services In-house Referral: Clinical Social Work              DME Arranged: N/A         HH Arranged: NA          Social Determinants of Health (SDOH) Interventions     Readmission Risk Interventions No flowsheet data found.

## 2019-06-19 NOTE — Progress Notes (Signed)
Pt BP 189/73. Dr. Marcille Blanco made aware. New order received.

## 2019-06-19 NOTE — Progress Notes (Signed)
1 time order of Hydralazine 100mg  PO given , will reassess BP

## 2019-06-19 NOTE — Progress Notes (Signed)
Patient BP 190/68 this am,,,, given PRN labetalol 0839...reassessed BP at 1100am. Bp 170/68 while lying down Pulse 70 . Patient toileted by another staff member and unable to collect occult stool , no blood noted in stool , notified Dr. Posey Pronto. Iron sucrose infusing at this time. Patient is A and O x 4. Able to make needs known.

## 2019-06-19 NOTE — Progress Notes (Signed)
Advanced care plan.  Purpose of the Encounter: CODE STATUS  Parties in Attendance: Patient herself  Patient's Decision Capacity: Intact  Subjective/Patient's story: Patient is 85 year old with history of essential hypertension GERD and diabetes type 2 who is presenting with symptomatic anemia.  Patient is noted to have severe iron deficiency anemia and has received blood and will receive IV iron.   Objective/Medical story I discussed with the patient regarding her desires for cardiac and pulmonary resuscitation.   Goals of care determination:  Patient states she would like to be a full code and will want everything to be done   CODE STATUS: Full code   Time spent discussing advanced care planning: 16 minutes

## 2019-06-19 NOTE — Care Management Obs Status (Signed)
[place] NOTIFICATION   Patient Details  Name: Nancy Green MRN: [id number] Date of Birth: Aug 14, 1930   Medicare Observation Status Notification Given:  Yes    Kowen Kluth, Veronia Beets, LCSW 06/19/2019, 11:29 AM

## 2019-06-20 LAB — BPAM RBC
Blood Product Expiration Date: 202010232359
Blood Product Expiration Date: 202010242359
Blood Product Expiration Date: 202011252359
Blood Product Expiration Date: 202011252359
ISSUE DATE / TIME: 202010192044
ISSUE DATE / TIME: 202010200021
ISSUE DATE / TIME: 202010200926
Unit Type and Rh: 5100
Unit Type and Rh: 5100
Unit Type and Rh: 9500
Unit Type and Rh: 9500

## 2019-06-20 LAB — TYPE AND SCREEN
ABO/RH(D): O NEG
Antibody Screen: NEGATIVE
Unit division: 0
Unit division: 0
Unit division: 0
Unit division: 0

## 2019-06-20 LAB — HEMOGLOBIN A1C
Hgb A1c MFr Bld: 6.2 % — ABNORMAL HIGH (ref 4.8–5.6)
Mean Plasma Glucose: 131 mg/dL

## 2019-07-09 ENCOUNTER — Telehealth: Payer: Self-pay | Admitting: Gastroenterology

## 2019-07-09 NOTE — Telephone Encounter (Signed)
Pt left  Vm to speak to Dr. Dorothey Baseman nurse

## 2019-07-09 NOTE — Telephone Encounter (Signed)
Pt called requesting a sooner office appt as her labs are really low and needs a procedure asap.

## 2019-07-11 ENCOUNTER — Other Ambulatory Visit: Payer: Self-pay

## 2019-07-11 ENCOUNTER — Ambulatory Visit: Payer: Medicare Other | Admitting: Anesthesiology

## 2019-07-11 ENCOUNTER — Encounter: Payer: Self-pay | Admitting: Gastroenterology

## 2019-07-11 ENCOUNTER — Telehealth: Payer: Self-pay | Admitting: Gastroenterology

## 2019-07-11 ENCOUNTER — Ambulatory Visit (INDEPENDENT_AMBULATORY_CARE_PROVIDER_SITE_OTHER): Payer: Medicare Other | Admitting: Gastroenterology

## 2019-07-11 ENCOUNTER — Encounter: Payer: Self-pay | Admitting: *Deleted

## 2019-07-11 VITALS — BP 168/57 | HR 90 | Temp 98.2°F | Ht 60.0 in | Wt 106.6 lb

## 2019-07-11 DIAGNOSIS — D500 Iron deficiency anemia secondary to blood loss (chronic): Secondary | ICD-10-CM

## 2019-07-11 DIAGNOSIS — E611 Iron deficiency: Secondary | ICD-10-CM | POA: Diagnosis not present

## 2019-07-11 MED ORDER — SUPREP BOWEL PREP KIT 17.5-3.13-1.6 GM/177ML PO SOLN: 1.0000 | ORAL | 0 refills | Status: DC

## 2019-07-11 NOTE — Telephone Encounter (Signed)
Pt had a couple of questions regarding her rx for the bowel prep and arrival time on Monday. All questions answered.

## 2019-07-11 NOTE — Telephone Encounter (Signed)
Patient called & has questions about her paperwork. Please call. Patient was seen in the [facility] office 07-11-19.

## 2019-07-11 NOTE — Anesthesia Preprocedure Evaluation (Deleted)
Anesthesia Evaluation  Patient identified by MRN, date of birth, ID band Patient awake    Reviewed: Allergy & Precautions, NPO status , Patient's Chart, lab work & pertinent test results  Airway Mallampati: IV   Neck ROM: Full    Dental  (+) Lower Dentures, Upper Dentures   Pulmonary former smoker (quit 20 years ago),    Pulmonary exam normal breath sounds clear to auscultation       Cardiovascular hypertension, + Peripheral Vascular Disease   Rhythm:Regular Rate:Normal + Systolic murmurs Murmur   Neuro/Psych PSYCHIATRIC DISORDERS Anxiety Hx cerebral aneurysm s/p surgery; temporal arteritis TIA   GI/Hepatic GERD (s/p Nissen)  ,  Endo/Other  diabetes, Type 2Hypothyroidism   Renal/GU      Musculoskeletal  (+) Arthritis ,   Abdominal   Peds  Hematology  (+) Blood dyscrasia, anemia ,   Anesthesia Other Findings Outpatient Hgb stable above 10, on 11/6  Reproductive/Obstetrics                           Anesthesia Physical Anesthesia Plan  ASA: III  Anesthesia Plan: General   Post-op Pain Management:    Induction: Intravenous  PONV Risk Score and Plan: 3 and Propofol infusion, TIVA and Treatment may vary due to age or medical condition  Airway Management Planned: Natural Airway  Additional Equipment:   Intra-op Plan:   Post-operative Plan:   Informed Consent: I have reviewed the patients History and Physical, chart, labs and discussed the procedure including the risks, benefits and alternatives for the proposed anesthesia with the patient or authorized representative who has indicated his/her understanding and acceptance.       Plan Discussed with: CRNA  Anesthesia Plan Comments: (Case canceled 07/16/19 for hypertension.  Pt with systolic BP 546 consistently, even after taking home Procardia, metoprolol 5mg  IV x2, and hydralazine 10mg  IV x1.  Made appointment with Dr.  Jorene Minors office same day.  Patient, patient's daughter, and Dr. Allen Norris all aware and in agreement.)      Anesthesia Quick Evaluation

## 2019-07-11 NOTE — H&P (View-Only) (Signed)
Primary Care Physician: Sofie Hartigan, MD  Primary Gastroenterologist:  Dr. Lucilla Lame  Chief Complaint  Patient presents with  . Iron deficiency anemia    HPI: Nancy Green is a 85 y.o. female here for anemia.  The patient was in the hospital recently and was found to have a hemoglobin of 5.8.  The patient was transfused 2 units of blood.  The patient had denied any black stools or bloody stools.  The patient's iron studies were also low with a iron saturation of 2.  After transfusion the patient's hemoglobin is 9.2.  The patient's iron at that time was 11.  She was sent home with follow-up as an outpatient for her anemia.  The patient had seen me in the past for dysphagia.  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 The patient recently had blood work done at her primary care provider's office and her hemoglobin was above 10.  The patient also reports that she has had approximately a 25 pound weight loss over the last few years.  Her main concern is that she states she feels dizzy and off balance for some time.  Current Outpatient Medications  Medication Sig Dispense Refill  . allopurinol (ZYLOPRIM) 100 MG tablet Take 100 mg by mouth daily.    . clopidogrel (PLAVIX) 75 MG tablet Take 75 mg by mouth daily.    . diazepam (VALIUM) 5 MG tablet Take 5 mg by mouth as needed for anxiety.    . insulin aspart (NOVOLOG FLEXPEN) 100 UNIT/ML FlexPen Inject into the skin 3 (three) times daily with meals.    Marland Kitchen loratadine (CLARITIN) 10 MG tablet Take 10 mg by mouth daily as needed for allergies.    Marland Kitchen losartan (COZAAR) 100 MG tablet Take 100 mg by mouth daily.    . metFORMIN (GLUCOPHAGE) 500 MG tablet Take by mouth at bedtime.    Marland Kitchen NIFEdipine (PROCARDIA XL/ADALAT-CC) 60 MG 24 hr tablet Take 60 mg by mouth daily.    Marland Kitchen omeprazole (PRILOSEC) 40 MG capsule Take 40 mg by mouth daily.    . predniSONE (DELTASONE) 10 MG tablet Take 12.5 mg by mouth  daily with breakfast.     . triamterene-hydrochlorothiazide (DYAZIDE) 37.5-25 MG per capsule Take 1 capsule by mouth daily.     . Vitamin D, Ergocalciferol, (DRISDOL) 50000 units CAPS capsule TAKE 1 CAPSULE BY MOUTH EVERY 14 DAYS    . albuterol (PROVENTIL HFA;VENTOLIN HFA) 108 (90 BASE) MCG/ACT inhaler Inhale 2 puffs into the lungs every 4 (four) hours as needed for wheezing or shortness of breath. (Patient not taking: Reported on 12/26/2017) 1 Inhaler 0  . pravastatin (PRAVACHOL) 10 MG tablet Take by mouth.     No current facility-administered medications for this visit.     Allergies as of 07/11/2019 - Review Complete 07/11/2019  Allergen Reaction Noted  . Neomycin Rash 07/26/2011  . Zantac [ranitidine hcl] Hives 12/24/2014  . Penicillin g Rash 12/24/2014    ROS:  General: Negative for anorexia, weight loss, fever, chills, fatigue, weakness. ENT: Negative for hoarseness, difficulty swallowing , nasal congestion. CV: Negative for chest pain, angina, palpitations, dyspnea on exertion, peripheral edema.  Respiratory: Negative for dyspnea at rest, dyspnea on exertion, cough, sputum, wheezing.  GI: See history of present illness. GU:  Negative for dysuria, hematuria, urinary incontinence, urinary frequency, nocturnal urination.  Endo: Negative for unusual weight change.    Physical Examination:   BP (!) 168/57  Pulse 90   Temp 98.2 F (36.8 C) (Temporal)   Ht 5' (1.524 m)   Wt 106 lb 9.6 oz (48.4 kg)   BMI 20.82 kg/m   General: Well-nourished, well-developed in no acute distress.  Eyes: No icterus. Conjunctivae pink. Lungs: Clear to auscultation bilaterally. Non-labored. Heart: Regular rate and rhythm, no murmurs rubs or gallops.  Abdomen: Bowel sounds are normal, nontender, nondistended, no hepatosplenomegaly or masses, no abdominal bruits or hernia , no rebound or guarding.   Extremities: No lower extremity edema. No clubbing or deformities. Neuro: Alert and oriented x 3.   Grossly intact. Skin: Warm and dry, no jaundice.   Psych: Alert and cooperative, normal mood and affect.  Labs:    Imaging Studies: No results found.  Assessment and Plan:   Nancy Green is a 85 y.o. y/o female who comes in today with iron deficiency anemia and the need for blood transfusions when her hemoglobin was found to be 5.8.  The patient's hemoglobin is now stable after being transfused with iron and blood.  The patient is concerned about where she may be losing blood and would like it investigated.  The patient will be set up for an EGD and colonoscopy. I have discussed risks & benefits which include, but are not limited to, bleeding, infection, perforation & drug reaction.  The patient agrees with this plan & written consent will be obtained.      Midge Minium, MD. Clementeen Graham    Note: This dictation was prepared with Dragon dictation along with smaller phrase technology. Any transcriptional errors that result from this process are unintentional.

## 2019-07-11 NOTE — Progress Notes (Signed)
Primary Care Physician: Sofie Hartigan, MD  Primary Gastroenterologist:  Dr. Lucilla Lame  Chief Complaint  Patient presents with  . Iron deficiency anemia    HPI: Nancy Green is a 85 y.o. female here for anemia.  The patient was in the hospital recently and was found to have a hemoglobin of 5.8.  The patient was transfused 2 units of blood.  The patient had denied any black stools or bloody stools.  The patient's iron studies were also low with a iron saturation of 2.  After transfusion the patient's hemoglobin is 9.2.  The patient's iron at that time was 11.  She was sent home with follow-up as an outpatient for her anemia.  The patient had seen me in the past for dysphagia.  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 The patient recently had blood work done at her primary care provider's office and her hemoglobin was above 10.  The patient also reports that she has had approximately a 25 pound weight loss over the last few years.  Her main concern is that she states she feels dizzy and off balance for some time.  Current Outpatient Medications  Medication Sig Dispense Refill  . allopurinol (ZYLOPRIM) 100 MG tablet Take 100 mg by mouth daily.    . clopidogrel (PLAVIX) 75 MG tablet Take 75 mg by mouth daily.    . diazepam (VALIUM) 5 MG tablet Take 5 mg by mouth as needed for anxiety.    . insulin aspart (NOVOLOG FLEXPEN) 100 UNIT/ML FlexPen Inject into the skin 3 (three) times daily with meals.    Marland Kitchen loratadine (CLARITIN) 10 MG tablet Take 10 mg by mouth daily as needed for allergies.    Marland Kitchen losartan (COZAAR) 100 MG tablet Take 100 mg by mouth daily.    . metFORMIN (GLUCOPHAGE) 500 MG tablet Take by mouth at bedtime.    Marland Kitchen NIFEdipine (PROCARDIA XL/ADALAT-CC) 60 MG 24 hr tablet Take 60 mg by mouth daily.    Marland Kitchen omeprazole (PRILOSEC) 40 MG capsule Take 40 mg by mouth daily.    . predniSONE (DELTASONE) 10 MG tablet Take 12.5 mg by mouth  daily with breakfast.     . triamterene-hydrochlorothiazide (DYAZIDE) 37.5-25 MG per capsule Take 1 capsule by mouth daily.     . Vitamin D, Ergocalciferol, (DRISDOL) 50000 units CAPS capsule TAKE 1 CAPSULE BY MOUTH EVERY 14 DAYS    . albuterol (PROVENTIL HFA;VENTOLIN HFA) 108 (90 BASE) MCG/ACT inhaler Inhale 2 puffs into the lungs every 4 (four) hours as needed for wheezing or shortness of breath. (Patient not taking: Reported on 12/26/2017) 1 Inhaler 0  . pravastatin (PRAVACHOL) 10 MG tablet Take by mouth.     No current facility-administered medications for this visit.     Allergies as of 07/11/2019 - Review Complete 07/11/2019  Allergen Reaction Noted  . Neomycin Rash 07/26/2011  . Zantac [ranitidine hcl] Hives 12/24/2014  . Penicillin g Rash 12/24/2014    ROS:  General: Negative for anorexia, weight loss, fever, chills, fatigue, weakness. ENT: Negative for hoarseness, difficulty swallowing , nasal congestion. CV: Negative for chest pain, angina, palpitations, dyspnea on exertion, peripheral edema.  Respiratory: Negative for dyspnea at rest, dyspnea on exertion, cough, sputum, wheezing.  GI: See history of present illness. GU:  Negative for dysuria, hematuria, urinary incontinence, urinary frequency, nocturnal urination.  Endo: Negative for unusual weight change.    Physical Examination:   BP (!) 168/57  Pulse 90   Temp 98.2 F (36.8 C) (Temporal)   Ht 5' (1.524 m)   Wt 106 lb 9.6 oz (48.4 kg)   BMI 20.82 kg/m   General: Well-nourished, well-developed in no acute distress.  Eyes: No icterus. Conjunctivae pink. Lungs: Clear to auscultation bilaterally. Non-labored. Heart: Regular rate and rhythm, no murmurs rubs or gallops.  Abdomen: Bowel sounds are normal, nontender, nondistended, no hepatosplenomegaly or masses, no abdominal bruits or hernia , no rebound or guarding.   Extremities: No lower extremity edema. No clubbing or deformities. Neuro: Alert and oriented x 3.   Grossly intact. Skin: Warm and dry, no jaundice.   Psych: Alert and cooperative, normal mood and affect.  Labs:    Imaging Studies: No results found.  Assessment and Plan:   Nancy Green is a 85 y.o. y/o female who comes in today with iron deficiency anemia and the need for blood transfusions when her hemoglobin was found to be 5.8.  The patient's hemoglobin is now stable after being transfused with iron and blood.  The patient is concerned about where she may be losing blood and would like it investigated.  The patient will be set up for an EGD and colonoscopy. I have discussed risks & benefits which include, but are not limited to, bleeding, infection, perforation & drug reaction.  The patient agrees with this plan & written consent will be obtained.      Midge Minium, MD. Clementeen Graham    Note: This dictation was prepared with Dragon dictation along with smaller phrase technology. Any transcriptional errors that result from this process are unintentional.

## 2019-07-12 ENCOUNTER — Other Ambulatory Visit
Admission: RE | Admit: 2019-07-12 | Discharge: 2019-07-12 | Disposition: A | Discharge status: Home / Self Care | Payer: Medicare Other | Source: Ambulatory Visit | Attending: Gastroenterology | Admitting: Gastroenterology

## 2019-07-12 ENCOUNTER — Other Ambulatory Visit: Payer: Self-pay

## 2019-07-12 DIAGNOSIS — Z01812 Encounter for preprocedural laboratory examination: Secondary | ICD-10-CM | POA: Diagnosis present

## 2019-07-12 DIAGNOSIS — Z20828 Contact with and (suspected) exposure to other viral communicable diseases: Secondary | ICD-10-CM | POA: Insufficient documentation

## 2019-07-13 LAB — SARS CORONAVIRUS 2 (TAT 6-24 HRS): SARS Coronavirus 2: NEGATIVE

## 2019-07-13 NOTE — Discharge Instructions (Signed)

## 2019-07-16 ENCOUNTER — Other Ambulatory Visit: Payer: Self-pay

## 2019-07-16 ENCOUNTER — Telehealth: Payer: Self-pay | Admitting: Gastroenterology

## 2019-07-16 ENCOUNTER — Ambulatory Visit
Admission: RE | Admit: 2019-07-16 | Discharge: 2019-07-16 | Disposition: A | Discharge status: Home / Self Care | Payer: Medicare Other | Attending: Gastroenterology | Admitting: Gastroenterology

## 2019-07-16 ENCOUNTER — Encounter
Admission: RE | Disposition: A | Discharge status: Home / Self Care | Payer: Self-pay | Source: Home / Self Care | Attending: Gastroenterology

## 2019-07-16 DIAGNOSIS — Z880 Allergy status to penicillin: Secondary | ICD-10-CM | POA: Insufficient documentation

## 2019-07-16 DIAGNOSIS — D509 Iron deficiency anemia, unspecified: Secondary | ICD-10-CM | POA: Insufficient documentation

## 2019-07-16 DIAGNOSIS — Z794 Long term (current) use of insulin: Secondary | ICD-10-CM | POA: Diagnosis not present

## 2019-07-16 DIAGNOSIS — Z888 Allergy status to other drugs, medicaments and biological substances status: Secondary | ICD-10-CM | POA: Diagnosis not present

## 2019-07-16 DIAGNOSIS — Z881 Allergy status to other antibiotic agents status: Secondary | ICD-10-CM | POA: Diagnosis not present

## 2019-07-16 DIAGNOSIS — R42 Dizziness and giddiness: Secondary | ICD-10-CM | POA: Diagnosis not present

## 2019-07-16 DIAGNOSIS — D649 Anemia, unspecified: Secondary | ICD-10-CM | POA: Diagnosis present

## 2019-07-16 DIAGNOSIS — Z7951 Long term (current) use of inhaled steroids: Secondary | ICD-10-CM | POA: Insufficient documentation

## 2019-07-16 DIAGNOSIS — Z5309 Procedure and treatment not carried out because of other contraindication: Secondary | ICD-10-CM | POA: Diagnosis not present

## 2019-07-16 DIAGNOSIS — Z79899 Other long term (current) drug therapy: Secondary | ICD-10-CM | POA: Diagnosis not present

## 2019-07-16 DIAGNOSIS — I10 Essential (primary) hypertension: Secondary | ICD-10-CM | POA: Diagnosis not present

## 2019-07-16 HISTORY — DX: Hypothyroidism, unspecified: E03.9

## 2019-07-16 HISTORY — DX: Anemia, unspecified: D64.9

## 2019-07-16 LAB — GLUCOSE, CAPILLARY: Glucose-Capillary: 131 mg/dL — ABNORMAL HIGH (ref 70–99)

## 2019-07-16 SURGERY — COLONOSCOPY WITH PROPOFOL
Anesthesia: General

## 2019-07-16 MED ORDER — METOPROLOL TARTRATE 5 MG/5ML IV SOLN
5.0000 mg | Freq: Once | INTRAVENOUS | Status: AC
  Administered 2019-07-16: 5 mg via INTRAVENOUS

## 2019-07-16 MED ORDER — HYDRALAZINE HCL 20 MG/ML IJ SOLN
10.0000 mg | Freq: Once | INTRAMUSCULAR | Status: AC
  Administered 2019-07-16: 10 mg via INTRAVENOUS

## 2019-07-16 MED ORDER — LACTATED RINGERS IV SOLN
10.0000 mL/h | INTRAVENOUS | Status: DC
  Administered 2019-07-16: 10:00:00 10 mL/h via INTRAVENOUS

## 2019-07-16 SURGICAL SUPPLY — 36 items
BALLN DILATOR 10-12 8 (BALLOONS)
BALLN DILATOR 12-15 8 (BALLOONS)
BALLN DILATOR 15-18 8 (BALLOONS)
BALLN DILATOR CRE 0-12 8 (BALLOONS)
BALLN DILATOR ESOPH 8 10 CRE (MISCELLANEOUS) IMPLANT
BALLOON DILATOR 12-15 8 (BALLOONS) IMPLANT
BALLOON DILATOR 15-18 8 (BALLOONS) IMPLANT
BALLOON DILATOR CRE 0-12 8 (BALLOONS) IMPLANT
BLOCK BITE 60FR ADLT L/F GRN (MISCELLANEOUS) ×4 IMPLANT
CANISTER SUCT 1200ML W/VALVE (MISCELLANEOUS) ×4 IMPLANT
CLIP HMST 235XBRD CATH ROT (MISCELLANEOUS) IMPLANT
CLIP RESOLUTION 360 11X235 (MISCELLANEOUS)
ELECT REM PT RETURN 9FT ADLT (ELECTROSURGICAL)
ELECTRODE REM PT RTRN 9FT ADLT (ELECTROSURGICAL) IMPLANT
FCP ESCP3.2XJMB 240X2.8X (MISCELLANEOUS)
FORCEPS BIOP RAD 4 LRG CAP 4 (CUTTING FORCEPS) IMPLANT
FORCEPS BIOP RJ4 240 W/NDL (MISCELLANEOUS)
FORCEPS ESCP3.2XJMB 240X2.8X (MISCELLANEOUS) IMPLANT
GOWN CVR UNV OPN BCK APRN NK (MISCELLANEOUS) ×4 IMPLANT
GOWN ISOL THUMB LOOP REG UNIV (MISCELLANEOUS) ×6
INJECTOR VARIJECT VIN23 (MISCELLANEOUS) IMPLANT
KIT DEFENDO VALVE AND CONN (KITS) IMPLANT
KIT ENDO PROCEDURE OLY (KITS) ×4 IMPLANT
MARKER SPOT ENDO TATTOO 5ML (MISCELLANEOUS) IMPLANT
PROBE APC STR FIRE (PROBE) IMPLANT
RETRIEVER NET PLAT FOOD (MISCELLANEOUS) IMPLANT
RETRIEVER NET ROTH 2.5X230 LF (MISCELLANEOUS) IMPLANT
SNARE SHORT THROW 13M SML OVAL (MISCELLANEOUS) IMPLANT
SNARE SHORT THROW 30M LRG OVAL (MISCELLANEOUS) IMPLANT
SNARE SNG USE RND 15MM (INSTRUMENTS) IMPLANT
SPOT EX ENDOSCOPIC TATTOO (MISCELLANEOUS)
SYR INFLATION 60ML (SYRINGE) IMPLANT
TRAP ETRAP POLY (MISCELLANEOUS) IMPLANT
VARIJECT INJECTOR VIN23 (MISCELLANEOUS)
WATER STERILE IRR 250ML POUR (IV SOLUTION) ×4 IMPLANT
WIRE CRE 18-20MM 8CM F G (MISCELLANEOUS) IMPLANT

## 2019-07-16 NOTE — Telephone Encounter (Signed)
Pt is calling to rescheduled her  Procedure from this morning

## 2019-07-16 NOTE — Interval H&P Note (Signed)
History and Physical Interval Note:  07/16/2019 9:59 AM  Nancy Green  has presented today for surgery, with the diagnosis of Iron deficiency anemia D50.0.  The various methods of treatment have been discussed with the patient and family. After consideration of risks, benefits and other options for treatment, the patient has consented to  Procedure(s): COLONOSCOPY WITH PROPOFOL (N/A) ESOPHAGOGASTRODUODENOSCOPY (EGD) WITH PROPOFOL (N/A) as a surgical intervention.  The patient's history has been reviewed, patient examined, no change in status, stable for surgery.  I have reviewed the patient's chart and labs.  Questions were answered to the patient's satisfaction.     Kaleb Sek [employer]

## 2019-07-16 NOTE — Progress Notes (Addendum)
Colonoscopy/EGD cancelled per anesthesia due to consistently elevated SBP (190-210s) despite procardia, metoprolol IV x 2, & hydralazine. Appt made with pt's PCP this afternoon at 1300.

## 2019-07-17 NOTE — Telephone Encounter (Signed)
Contacted pt and discussed cancelled Colonoscopy and EGD yesterday. Pt's BP was too high to perform procedures. Pt was instructed to contact her PCP. I advised pt to discuss this issue with him and once she has been cleared, we will reschedule her procedures.

## 2019-07-23 ENCOUNTER — Ambulatory Visit: Payer: Medicare Other | Admitting: Gastroenterology

## 2019-11-19 ENCOUNTER — Other Ambulatory Visit: Payer: Self-pay

## 2019-11-19 DIAGNOSIS — D500 Iron deficiency anemia secondary to blood loss (chronic): Secondary | ICD-10-CM

## 2019-11-19 MED ORDER — SUPREP BOWEL PREP KIT 17.5-3.13-1.6 GM/177ML PO SOLN: 1.0000 | ORAL | 0 refills | Status: DC

## 2019-11-26 ENCOUNTER — Telehealth: Payer: Self-pay

## 2019-11-26 NOTE — Telephone Encounter (Signed)
Returned pt's call and she was just confirming she was also having an EGD along with her colonoscopy. Confirmed with her she was having both procedures.

## 2019-11-26 NOTE — Telephone Encounter (Signed)
Patient has procedure questions. Please advise

## 2019-11-28 ENCOUNTER — Encounter: Payer: Self-pay | Admitting: Gastroenterology

## 2019-12-06 ENCOUNTER — Other Ambulatory Visit
Admission: RE | Admit: 2019-12-06 | Discharge: 2019-12-06 | Disposition: A | Discharge status: Home / Self Care | Payer: Medicare Other | Source: Ambulatory Visit | Attending: Gastroenterology | Admitting: Gastroenterology

## 2019-12-06 DIAGNOSIS — Z01812 Encounter for preprocedural laboratory examination: Secondary | ICD-10-CM | POA: Insufficient documentation

## 2019-12-06 DIAGNOSIS — Z20822 Contact with and (suspected) exposure to covid-19: Secondary | ICD-10-CM | POA: Insufficient documentation

## 2019-12-06 LAB — SARS CORONAVIRUS 2 (TAT 6-24 HRS): SARS Coronavirus 2: NEGATIVE

## 2019-12-07 NOTE — Discharge Instructions (Signed)
General Anesthesia, Adult, Care After This sheet gives you information about how to care for yourself after your procedure. Your health care provider may also give you more specific instructions. If you have problems or questions, contact your health care provider. What can I expect after the procedure? After the procedure, the following side effects are common:  Pain or discomfort at the IV site.  Nausea.  Vomiting.  Sore throat.  Trouble concentrating.  Feeling cold or chills.  Weak or tired.  Sleepiness and fatigue.  Soreness and body aches. These side effects can affect parts of the body that were not involved in surgery. Follow these instructions at home:  For at least 24 hours after the procedure:  Have a responsible adult stay with you. It is important to have someone help care for you until you are awake and alert.  Rest as needed.  Do not: ? Participate in activities in which you could fall or become injured. ? Drive. ? Use heavy machinery. ? Drink alcohol. ? Take sleeping pills or medicines that cause drowsiness. ? Make important decisions or sign legal documents. ? Take care of children on your own. Eating and drinking  Follow any instructions from your health care provider about eating or drinking restrictions.  When you feel hungry, start by eating small amounts of foods that are soft and easy to digest (bland), such as toast. Gradually return to your regular diet.  Drink enough fluid to keep your urine pale yellow.  If you vomit, rehydrate by drinking water, juice, or clear broth. General instructions  If you have sleep apnea, surgery and certain medicines can increase your risk for breathing problems. Follow instructions from your health care provider about wearing your sleep device: ? Anytime you are sleeping, including during daytime naps. ? While taking prescription pain medicines, sleeping medicines, or medicines that make you drowsy.  Return to  your normal activities as told by your health care provider. Ask your health care provider what activities are safe for you.  Take over-the-counter and prescription medicines only as told by your health care provider.  If you smoke, do not smoke without supervision.  Keep all follow-up visits as told by your health care provider. This is important. Contact a health care provider if:  You have nausea or vomiting that does not get better with medicine.  You cannot eat or drink without vomiting.  You have pain that does not get better with medicine.  You are unable to pass urine.  You develop a skin rash.  You have a fever.  You have redness around your IV site that gets worse. Get help right away if:  You have difficulty breathing.  You have chest pain.  You have blood in your urine or stool, or you vomit blood. Summary  After the procedure, it is common to have a sore throat or nausea. It is also common to feel tired.  Have a responsible adult stay with you for the first 24 hours after general anesthesia. It is important to have someone help care for you until you are awake and alert.  When you feel hungry, start by eating small amounts of foods that are soft and easy to digest (bland), such as toast. Gradually return to your regular diet.  Drink enough fluid to keep your urine pale yellow.  Return to your normal activities as told by your health care provider. Ask your health care provider what activities are safe for you. This information is not   intended to replace advice given to you by your health care provider. Make sure you discuss any questions you have with your health care provider. Document Revised: 08/19/2017 Document Reviewed: 04/01/2017 Elsevier Patient Education  2020 Elsevier Inc.  

## 2019-12-10 ENCOUNTER — Ambulatory Visit: Payer: Medicare Other | Admitting: Anesthesiology

## 2019-12-10 ENCOUNTER — Encounter
Admission: RE | Disposition: A | Discharge status: Home / Self Care | Payer: Self-pay | Source: Home / Self Care | Attending: Gastroenterology

## 2019-12-10 ENCOUNTER — Encounter: Payer: Self-pay | Admitting: Gastroenterology

## 2019-12-10 ENCOUNTER — Other Ambulatory Visit: Payer: Self-pay

## 2019-12-10 ENCOUNTER — Ambulatory Visit
Admission: RE | Admit: 2019-12-10 | Discharge: 2019-12-10 | Disposition: A | Discharge status: Home / Self Care | Payer: Medicare Other | Attending: Gastroenterology | Admitting: Gastroenterology

## 2019-12-10 DIAGNOSIS — Z7902 Long term (current) use of antithrombotics/antiplatelets: Secondary | ICD-10-CM | POA: Diagnosis not present

## 2019-12-10 DIAGNOSIS — Z79899 Other long term (current) drug therapy: Secondary | ICD-10-CM | POA: Diagnosis not present

## 2019-12-10 DIAGNOSIS — E039 Hypothyroidism, unspecified: Secondary | ICD-10-CM | POA: Insufficient documentation

## 2019-12-10 DIAGNOSIS — M109 Gout, unspecified: Secondary | ICD-10-CM | POA: Insufficient documentation

## 2019-12-10 DIAGNOSIS — H548 Legal blindness, as defined in USA: Secondary | ICD-10-CM | POA: Diagnosis not present

## 2019-12-10 DIAGNOSIS — I10 Essential (primary) hypertension: Secondary | ICD-10-CM | POA: Insufficient documentation

## 2019-12-10 DIAGNOSIS — Z7952 Long term (current) use of systemic steroids: Secondary | ICD-10-CM | POA: Diagnosis not present

## 2019-12-10 DIAGNOSIS — K449 Diaphragmatic hernia without obstruction or gangrene: Secondary | ICD-10-CM | POA: Insufficient documentation

## 2019-12-10 DIAGNOSIS — Z79890 Hormone replacement therapy (postmenopausal): Secondary | ICD-10-CM | POA: Insufficient documentation

## 2019-12-10 DIAGNOSIS — Z87891 Personal history of nicotine dependence: Secondary | ICD-10-CM | POA: Insufficient documentation

## 2019-12-10 DIAGNOSIS — Z9889 Other specified postprocedural states: Secondary | ICD-10-CM | POA: Insufficient documentation

## 2019-12-10 DIAGNOSIS — K5790 Diverticulosis of intestine, part unspecified, without perforation or abscess without bleeding: Secondary | ICD-10-CM | POA: Insufficient documentation

## 2019-12-10 DIAGNOSIS — K219 Gastro-esophageal reflux disease without esophagitis: Secondary | ICD-10-CM | POA: Diagnosis not present

## 2019-12-10 DIAGNOSIS — K640 First degree hemorrhoids: Secondary | ICD-10-CM | POA: Diagnosis not present

## 2019-12-10 DIAGNOSIS — D500 Iron deficiency anemia secondary to blood loss (chronic): Secondary | ICD-10-CM

## 2019-12-10 DIAGNOSIS — D509 Iron deficiency anemia, unspecified: Secondary | ICD-10-CM | POA: Insufficient documentation

## 2019-12-10 DIAGNOSIS — K621 Rectal polyp: Secondary | ICD-10-CM

## 2019-12-10 DIAGNOSIS — E1151 Type 2 diabetes mellitus with diabetic peripheral angiopathy without gangrene: Secondary | ICD-10-CM | POA: Insufficient documentation

## 2019-12-10 DIAGNOSIS — F419 Anxiety disorder, unspecified: Secondary | ICD-10-CM | POA: Diagnosis not present

## 2019-12-10 DIAGNOSIS — Z794 Long term (current) use of insulin: Secondary | ICD-10-CM | POA: Diagnosis not present

## 2019-12-10 HISTORY — DX: Complete loss of teeth, unspecified cause, unspecified class: K08.109

## 2019-12-10 HISTORY — PX: POLYPECTOMY: SHX5525

## 2019-12-10 HISTORY — PX: COLONOSCOPY WITH PROPOFOL: SHX5780

## 2019-12-10 HISTORY — PX: ESOPHAGOGASTRODUODENOSCOPY (EGD) WITH PROPOFOL: SHX5813

## 2019-12-10 HISTORY — DX: Asymptomatic varicose veins of bilateral lower extremities: I83.93

## 2019-12-10 LAB — GLUCOSE, CAPILLARY
Glucose-Capillary: 160 mg/dL — ABNORMAL HIGH (ref 70–99)
Glucose-Capillary: 160 mg/dL — ABNORMAL HIGH (ref 70–99)

## 2019-12-10 SURGERY — COLONOSCOPY WITH PROPOFOL
Anesthesia: General | Site: Rectum

## 2019-12-10 MED ORDER — LACTATED RINGERS IV SOLN: 10.0000 mL/h | INTRAVENOUS | Status: DC

## 2019-12-10 MED ORDER — ACETAMINOPHEN 325 MG PO TABS: 325.0000 mg | ORAL_TABLET | Freq: Once | ORAL | Status: DC

## 2019-12-10 MED ORDER — LIDOCAINE HCL (CARDIAC) PF 100 MG/5ML IV SOSY
PREFILLED_SYRINGE | INTRAVENOUS | Status: DC | PRN
  Administered 2019-12-10: 20 mg via INTRAVENOUS

## 2019-12-10 MED ORDER — SODIUM CHLORIDE 0.9 % IV SOLN: INTRAVENOUS | Status: DC

## 2019-12-10 MED ORDER — GLYCOPYRROLATE 0.2 MG/ML IJ SOLN
INTRAMUSCULAR | Status: DC | PRN
  Administered 2019-12-10: .2 mg via INTRAVENOUS

## 2019-12-10 MED ORDER — STERILE WATER FOR IRRIGATION IR SOLN
Status: DC | PRN
  Administered 2019-12-10: 50 mL

## 2019-12-10 MED ORDER — ACETAMINOPHEN 160 MG/5ML PO SOLN: 325.0000 mg | Freq: Once | ORAL | Status: DC

## 2019-12-10 MED ORDER — PROPOFOL 10 MG/ML IV BOLUS
INTRAVENOUS | Status: DC | PRN
  Administered 2019-12-10: 10 mg via INTRAVENOUS
  Administered 2019-12-10: 20 mg via INTRAVENOUS
  Administered 2019-12-10: 10 mg via INTRAVENOUS
  Administered 2019-12-10: 50 mg via INTRAVENOUS
  Administered 2019-12-10 (×3): 10 mg via INTRAVENOUS
  Administered 2019-12-10 (×3): 20 mg via INTRAVENOUS
  Administered 2019-12-10: 10 mg via INTRAVENOUS

## 2019-12-10 SURGICAL SUPPLY — 10 items
BLOCK BITE 60FR ADLT L/F GRN (MISCELLANEOUS) ×4 IMPLANT
CANISTER SUCT 1200ML W/VALVE (MISCELLANEOUS) ×4 IMPLANT
ELECT REM PT RETURN 9FT ADLT (ELECTROSURGICAL) ×4
ELECTRODE REM PT RTRN 9FT ADLT (ELECTROSURGICAL) IMPLANT
GOWN CVR UNV OPN BCK APRN NK (MISCELLANEOUS) ×4 IMPLANT
GOWN ISOL THUMB LOOP REG UNIV (MISCELLANEOUS) ×8
KIT ENDO PROCEDURE OLY (KITS) ×4 IMPLANT
SNARE SHORT THROW 13M SML OVAL (MISCELLANEOUS) ×2 IMPLANT
TRAP ETRAP POLY (MISCELLANEOUS) ×2 IMPLANT
WATER STERILE IRR 250ML POUR (IV SOLUTION) ×4 IMPLANT

## 2019-12-10 NOTE — H&P (Signed)
Lucilla Lame, MD [address] [place], [place] [address] Phone:[phone] Fax : [phone]  Primary Care Physician:  Sofie Hartigan, MD Primary Gastroenterologist:  Dr. Allen Norris  Pre-Procedure History & Physical: HPI:  Nancy Green is a 86 y.o. female is here for an endoscopy and colonoscopy.   Past Medical History:  Diagnosis Date  . Anemia   . Aneurysm ([place]) 1967   removed yrs ago - craniotomy  . Arthritis    fingers, "all over"  . Diabetes mellitus without complication ([place])   . Full dentures   . GERD (gastroesophageal reflux disease)   . Gout   . Heart murmur    followed by PCP  . History of hiatal hernia    "repaired"  . Hypertension   . Hypothyroidism   . Legal blindness of left eye, as defined in [place]   . Varicose veins of both lower extremities     Past Surgical History:  Procedure Laterality Date  . ABDOMINAL HYSTERECTOMY    . APPENDECTOMY    . CATARACT EXTRACTION W/PHACO Left 09/17/2015   Procedure: CATARACT EXTRACTION PHACO AND INTRAOCULAR LENS PLACEMENT (IOC);  Surgeon: Leandrew Koyanagi, MD;  Location: [facility];  Service: Ophthalmology;  Laterality: Left;  DIABETIC - insulin and oral meds  . CATARACT EXTRACTION W/PHACO Right 10/22/2015   Procedure: CATARACT EXTRACTION PHACO AND INTRAOCULAR LENS PLACEMENT (IOC);  Surgeon: Leandrew Koyanagi, MD;  Location: [facility];  Service: Ophthalmology;  Laterality: Right;  DIABETIC - insulin and oral meds  . CERVICAL DISC SURGERY    . CHOLECYSTECTOMY    . COLONOSCOPY    . CRANIOTOMY  1967  . ESOPHAGEAL DILATION  01/02/2018   Procedure: ESOPHAGEAL DILATION;  Surgeon: Lucilla Lame, MD;  Location: [facility];  Service: Endoscopy;;  . ESOPHAGOGASTRODUODENOSCOPY (EGD) WITH PROPOFOL N/A 01/02/2018   Procedure: ESOPHAGOGASTRODUODENOSCOPY (EGD) WITH PROPOFOL;  Surgeon: Lucilla Lame, MD;  Location: [facility];  Service: Endoscopy;  Laterality: N/A;  DIABETIC   . hiatel hernia      nissen fundiplication  . RECTAL SURGERY      Prior to Admission medications   Medication Sig Start Date End Date Taking? Authorizing Provider  allopurinol (ZYLOPRIM) 100 MG tablet Take 100 mg by mouth daily.   Yes [provider]  clopidogrel (PLAVIX) 75 MG tablet Take 75 mg by mouth daily.   Yes [provider]  diazepam (VALIUM) 5 MG tablet Take 5 mg by mouth as needed for anxiety.   Yes [provider]  Ferrous Sulfate (IRON SUPPLEMENT PO) Take by mouth daily.   Yes [provider]  hydrochlorothiazide (HYDRODIURIL) 25 MG tablet Take 25 mg by mouth daily.   Yes [provider]  insulin aspart (NOVOLOG FLEXPEN) 100 UNIT/ML FlexPen Inject into the skin 3 (three) times daily with meals.   Yes [provider]  insulin aspart protamine- aspart (NOVOLOG MIX 70/30) (70-30) 100 UNIT/ML injection Inject 6 Units into the skin daily with breakfast.   Yes [provider]  levothyroxine (SYNTHROID) 25 MCG tablet Take 25 mcg by mouth daily before breakfast.   Yes [provider]  loratadine (CLARITIN) 5 MG chewable tablet Chew 5 mg by mouth daily.   Yes [provider]  losartan (COZAAR) 100 MG tablet Take 25 mg by mouth daily.    Yes [provider]  metFORMIN (GLUCOPHAGE) 500 MG tablet Take by mouth 2 (two) times daily. 1 tab AM, 2 tabs PM   Yes [provider]  NIFEdipine (PROCARDIA XL/ADALAT-CC) 60 MG 24 hr tablet Take 60 mg by mouth daily.   Yes [provider]  omeprazole (PRILOSEC) 40 MG capsule Take 40 mg by mouth daily.   Yes [provider]  pravastatin (PRAVACHOL) 10 MG tablet Take by mouth. 12/21/16 12/10/19 Yes [provider]  predniSONE (DELTASONE) 10 MG tablet Take 2.5 mg by mouth every other day.    Yes [provider]  Vitamin D, Ergocalciferol, (DRISDOL) 50000 units CAPS capsule TAKE 1 CAPSULE BY MOUTH EVERY 14 DAYS 03/30/17  Yes  [provider]  Na Sulfate-K Sulfate-Mg Sulf (SUPREP BOWEL PREP KIT) 17.5-3.13-1.6 GM/177ML SOLN Take 1 kit by mouth as directed. 07/11/19   Lucilla Lame, MD  Na Sulfate-K Sulfate-Mg Sulf (SUPREP BOWEL PREP KIT) 17.5-3.13-1.6 GM/177ML SOLN Take 1 kit by mouth as directed. 11/19/19   Lucilla Lame, MD    Allergies as of 11/19/2019 - Review Complete 07/16/2019  Allergen Reaction Noted  . Neomycin Rash 07/26/2011  . Zantac [ranitidine hcl] Hives 12/24/2014  . Penicillin g Rash 12/24/2014    Family History  Problem Relation Age of Onset  . Varicose Veins Neg Hx   . Vision loss Neg Hx     Social History   Socioeconomic History  . Marital status: Widowed    Spouse name: Not on file  . Number of children: Not on file  . Years of education: Not on file  . Highest education level: Not on file  Occupational History  . Not on file  Tobacco Use  . Smoking status: Former Research scientist (life sciences)  . Smokeless tobacco: Never Used  . Tobacco comment: quit 20+ yrs ago  Substance and Sexual Activity  . Alcohol use: No  . Drug use: Never  . Sexual activity: Not on file  Other Topics Concern  . Not on file  Social History Narrative  . Not on file   Social Determinants of Health   Financial Resource Strain:   . Difficulty of Paying Living Expenses:   Food Insecurity:   . Worried About Charity fundraiser in the Last Year:   . Arboriculturist in the Last Year:   Transportation Needs:   . Film/video editor (Medical):   Marland Kitchen Lack of Transportation (Non-Medical):   Physical Activity:   . Days of Exercise per Week:   . Minutes of Exercise per Session:   Stress:   . Feeling of Stress :   Social Connections:   . Frequency of Communication with Friends and Family:   . Frequency of Social Gatherings with Friends and Family:   . Attends Religious Services:   . Active Member of Clubs or Organizations:   . Attends Archivist Meetings:   Marland Kitchen Marital Status:   Intimate Partner Violence:     . Fear of Current or Ex-Partner:   . Emotionally Abused:   Marland Kitchen Physically Abused:   . Sexually Abused:     Review of Systems: See HPI, otherwise negative ROS  Physical Exam: BP (!) 173/70   Pulse (!) 102   Temp 97.9 F (36.6 C) (Temporal)   Ht 5' (1.524 m)   Wt 48.5 kg   SpO2 97%   BMI 20.90 kg/m  General:   Alert,  pleasant and cooperative in NAD Head:  Normocephalic and atraumatic. Neck:  Supple; no masses or thyromegaly. Lungs:  Clear throughout to auscultation.    Heart:  Regular rate and rhythm. Abdomen:  Soft, nontender and nondistended. Normal  bowel sounds, without guarding, and without rebound.   Neurologic:  Alert and  oriented x4;  grossly normal neurologically.  Impression/Plan: Nancy Green is here for an endoscopy and colonoscopy to be performed for IDA  Risks, benefits, limitations, and alternatives regarding  endoscopy and colonoscopy have been reviewed with the patient.  Questions have been answered.  All parties agreeable.   Lucilla Lame, MD  12/10/2019, 7:26 AM

## 2019-12-10 NOTE — Anesthesia Preprocedure Evaluation (Signed)
Anesthesia Evaluation  Patient identified by MRN, date of birth, ID band Patient awake    Reviewed: Allergy & Precautions, H&P , NPO status , Patient's Chart, lab work & pertinent test results  Airway Mallampati: III  TM Distance: >3 FB Neck ROM: full    Dental  (+) Upper Dentures, Lower Dentures   Pulmonary former smoker,    Pulmonary exam normal breath sounds clear to auscultation       Cardiovascular hypertension, + Peripheral Vascular Disease   Rhythm:regular Rate:Normal + Systolic murmurs    Neuro/Psych Anxiety    GI/Hepatic hiatal hernia, GERD  ,  Endo/Other  diabetesHypothyroidism   Renal/GU      Musculoskeletal   Abdominal   Peds  Hematology   Anesthesia Other Findings   Reproductive/Obstetrics                             Anesthesia Physical Anesthesia Plan  ASA: III  Anesthesia Plan: General   Post-op Pain Management:    Induction: Intravenous  PONV Risk Score and Plan: 3 and Treatment may vary due to age or medical condition, Propofol infusion and TIVA  Airway Management Planned: Natural Airway  Additional Equipment:   Intra-op Plan:   Post-operative Plan:   Informed Consent: I have reviewed the patients History and Physical, chart, labs and discussed the procedure including the risks, benefits and alternatives for the proposed anesthesia with the patient or authorized representative who has indicated his/her understanding and acceptance.     Dental Advisory Given  Plan Discussed with: CRNA  Anesthesia Plan Comments:         Anesthesia Quick Evaluation

## 2019-12-10 NOTE — Anesthesia Procedure Notes (Signed)
Procedure Name: MAC Date/Time: 12/10/2019 7:56 AM Performed by: Vanetta Shawl, CRNA Pre-anesthesia Checklist: Patient identified, Emergency Drugs available, Suction available, Timeout performed and Patient being monitored Patient Re-evaluated:Patient Re-evaluated prior to induction Oxygen Delivery Method: Nasal cannula Placement Confirmation: positive ETCO2

## 2019-12-10 NOTE — Transfer of Care (Signed)
Immediate Anesthesia Transfer of Care Note  Patient: Nancy Green  Procedure(s) Performed: COLONOSCOPY WITH PROPOFOL (N/A ) ESOPHAGOGASTRODUODENOSCOPY (EGD) WITH PROPOFOL (N/A Esophagus)  Patient Location: PACU  Anesthesia Type: General  Level of Consciousness: awake, alert  and patient cooperative  Airway and Oxygen Therapy: Patient Spontanous Breathing and Patient connected to supplemental oxygen  Post-op Assessment: Post-op Vital signs reviewed, Patient's Cardiovascular Status Stable, Respiratory Function Stable, Patent Airway and No signs of Nausea or vomiting  Post-op Vital Signs: Reviewed and stable  Complications: No apparent anesthesia complications

## 2019-12-10 NOTE — Op Note (Signed)
[facility] Gastroenterology Patient Name: Nancy Green Procedure Date: 12/10/2019 7:48 AM MRN: [id number] Account #: [id number] Date of Birth: 1930/01/24 Admit Type: Outpatient Age: 86 Room: [facility] OR ROOM 01 Gender: Female Note Status: Finalized Procedure:             Upper GI endoscopy Indications:           Iron deficiency anemia Providers:             Midge Minium MD, MD Referring MD:          Marina Goodell (Referring MD) Medicines:             Propofol per Anesthesia Complications:         No immediate complications. Procedure:             Pre-Anesthesia Assessment:                        - Prior to the procedure, a History and Physical was                         performed, and patient medications and allergies were                         reviewed. The patient's tolerance of previous                         anesthesia was also reviewed. The risks and benefits                         of the procedure and the sedation options and risks                         were discussed with the patient. All questions were                         answered, and informed consent was obtained. Prior                         Anticoagulants: The patient has taken no previous                         anticoagulant or antiplatelet agents. ASA Grade                         Assessment: II - A patient with mild systemic disease.                         After reviewing the risks and benefits, the patient                         was deemed in satisfactory condition to undergo the                         procedure.                        After obtaining informed consent, the endoscope was  passed under direct vision. Throughout the procedure,                         the patient's blood pressure, pulse, and oxygen                         saturations were monitored continuously. The was                         introduced through the mouth, and advanced to the                          second part of duodenum. The upper GI endoscopy was                         accomplished without difficulty. The patient tolerated                         the procedure well. Findings:      A small hiatal hernia was present.      Evidence of a Nissen fundoplication was found at the gastroesophageal       junction. This was traversed.      The examined duodenum was normal. Impression:            - Small hiatal hernia.                        - A Nissen fundoplication was found.                        - Normal examined duodenum.                        - No specimens collected. Recommendation:        - Discharge patient to home.                        - Resume previous diet.                        - Continue present medications.                        - Perform a colonoscopy today. Procedure Code(s):     --- Professional ---                        [phone], Esophagogastroduodenoscopy, flexible,                         transoral; diagnostic, including collection of                         specimen(s) by brushing or washing, when performed                         (separate procedure) Diagnosis Code(s):     --- Professional ---                        D50.9, Iron deficiency anemia, unspecified  Z61.096, Other specified postprocedural states CPT copyright 2019 American Medical Association. All rights reserved. The codes documented in this report are preliminary and upon coder review may  be revised to meet current compliance requirements. Midge Minium MD, MD 12/10/2019 8:03:02 AM This report has been signed electronically. Number of Addenda: 0 Note Initiated On: 12/10/2019 7:48 AM Total Procedure Duration: 0 hours 2 minutes 29 seconds  Estimated Blood Loss:  Estimated blood loss: none.      [facility]

## 2019-12-10 NOTE — Op Note (Signed)
[facility] Gastroenterology Patient Name: Graviela Nodal Procedure Date: 12/10/2019 7:47 AM MRN: [id number] Account #: [id number] Date of Birth: [birth year] Admit Type: Outpatient Age: 86 Room: [facility] OR ROOM 01 Gender: Female Note Status: Finalized Procedure:             Colonoscopy Indications:           Iron deficiency anemia Providers:             Midge Minium MD, MD Medicines:             Propofol per Anesthesia Complications:         No immediate complications. Procedure:             Pre-Anesthesia Assessment:                        - Prior to the procedure, a History and Physical was                         performed, and patient medications and allergies were                         reviewed. The patient's tolerance of previous                         anesthesia was also reviewed. The risks and benefits                         of the procedure and the sedation options and risks                         were discussed with the patient. All questions were                         answered, and informed consent was obtained. Prior                         Anticoagulants: The patient has taken no previous                         anticoagulant or antiplatelet agents. ASA Grade                         Assessment: II - A patient with mild systemic disease.                         After reviewing the risks and benefits, the patient                         was deemed in satisfactory condition to undergo the                         procedure.                        After obtaining informed consent, the colonoscope was                         passed under direct vision. Throughout the procedure,  the patient's blood pressure, pulse, and oxygen                         saturations were monitored continuously. The was                         introduced through the anus and advanced to the the                         cecum, identified by appendiceal  orifice and ileocecal                         valve. The colonoscopy was performed without                         difficulty. The patient tolerated the procedure well.                         The quality of the bowel preparation was good. Findings:      The perianal and digital rectal examinations were normal.      Multiple small-mouthed diverticula were found in the entire colon.      Non-bleeding internal hemorrhoids were found during retroflexion. The       hemorrhoids were Grade I (internal hemorrhoids that do not prolapse).      A 6 mm polyp was found in the rectum. The polyp was sessile. The polyp       was removed with a hot snare. Resection and retrieval were complete. Impression:            - Diverticulosis in the entire examined colon.                        - Non-bleeding internal hemorrhoids.                        - One 6 mm polyp in the rectum, removed with a hot                         snare. Resected and retrieved. Recommendation:        - Discharge patient to home.                        - Resume previous diet.                        - Continue present medications.                        - Await pathology results. Procedure Code(s):     --- Professional ---                        [phone], Colonoscopy, flexible; with removal of                         tumor(s), polyp(s), or other lesion(s) by snare                         technique Diagnosis Code(s):     --- Professional ---  D50.9, Iron deficiency anemia, unspecified                        K62.1, Rectal polyp CPT copyright 2019 American Medical Association. All rights reserved. The codes documented in this report are preliminary and upon coder review may  be revised to meet current compliance requirements. Lucilla Lame MD, MD 12/10/2019 8:26:01 AM This report has been signed electronically. Number of Addenda: 0 Note Initiated On: 12/10/2019 7:47 AM Scope Withdrawal Time: 0 hours 7 minutes 56  seconds  Total Procedure Duration: 0 hours 17 minutes 45 seconds  Estimated Blood Loss:  Estimated blood loss: none. Estimated blood loss: none.      [facility]

## 2019-12-10 NOTE — Anesthesia Postprocedure Evaluation (Signed)
Anesthesia Post Note  Patient: Nancy Green  Procedure(s) Performed: COLONOSCOPY WITH BIOPSY (N/A Rectum) ESOPHAGOGASTRODUODENOSCOPY (EGD) WITH PROPOFOL (N/A Esophagus) POLYPECTOMY (N/A Rectum)     Patient location during evaluation: PACU Anesthesia Type: General Level of consciousness: awake and alert and oriented Pain management: satisfactory to patient Vital Signs Assessment: post-procedure vital signs reviewed and stable Respiratory status: spontaneous breathing, nonlabored ventilation and respiratory function stable Cardiovascular status: blood pressure returned to baseline and stable Postop Assessment: Adequate PO intake and No signs of nausea or vomiting Anesthetic complications: no    Cherly [place]

## 2019-12-11 ENCOUNTER — Encounter: Payer: Self-pay | Admitting: *Deleted

## 2019-12-12 ENCOUNTER — Encounter: Payer: Self-pay | Admitting: Gastroenterology

## 2019-12-12 LAB — SURGICAL PATHOLOGY

## 2020-01-10 ENCOUNTER — Telehealth (INDEPENDENT_AMBULATORY_CARE_PROVIDER_SITE_OTHER): Payer: Self-pay | Admitting: Vascular Surgery

## 2020-01-10 NOTE — Telephone Encounter (Signed)
Called wanting to come in to be seen. She says she isn't having any issues, but she wants to get the circulation in her legs checked. She was last seen 06/16/18 w/ ABI studies and seen FB. Please advise.

## 2020-01-10 NOTE — Telephone Encounter (Signed)
That's fine. She can come in with ABIs to see me or Dew.

## 2020-01-25 ENCOUNTER — Other Ambulatory Visit (INDEPENDENT_AMBULATORY_CARE_PROVIDER_SITE_OTHER): Payer: Self-pay | Admitting: Nurse Practitioner

## 2020-01-25 DIAGNOSIS — I739 Peripheral vascular disease, unspecified: Secondary | ICD-10-CM

## 2020-01-30 ENCOUNTER — Ambulatory Visit (INDEPENDENT_AMBULATORY_CARE_PROVIDER_SITE_OTHER): Payer: Medicare Other | Admitting: Nurse Practitioner

## 2020-01-30 ENCOUNTER — Other Ambulatory Visit: Payer: Self-pay

## 2020-01-30 ENCOUNTER — Ambulatory Visit (INDEPENDENT_AMBULATORY_CARE_PROVIDER_SITE_OTHER): Payer: Medicare Other

## 2020-01-30 ENCOUNTER — Encounter (INDEPENDENT_AMBULATORY_CARE_PROVIDER_SITE_OTHER): Payer: Self-pay | Admitting: Nurse Practitioner

## 2020-01-30 VITALS — BP 179/68 | HR 94 | Ht 60.0 in | Wt 108.0 lb

## 2020-01-30 DIAGNOSIS — I739 Peripheral vascular disease, unspecified: Secondary | ICD-10-CM

## 2020-01-30 DIAGNOSIS — I10 Essential (primary) hypertension: Secondary | ICD-10-CM | POA: Diagnosis not present

## 2020-01-30 DIAGNOSIS — E1169 Type 2 diabetes mellitus with other specified complication: Secondary | ICD-10-CM | POA: Diagnosis not present

## 2020-01-30 DIAGNOSIS — I6523 Occlusion and stenosis of bilateral carotid arteries: Secondary | ICD-10-CM

## 2020-01-30 DIAGNOSIS — E785 Hyperlipidemia, unspecified: Secondary | ICD-10-CM

## 2020-01-30 NOTE — Progress Notes (Signed)
Subjective:    Patient ID: Nancy Green, female    DOB: 05-23-1930, 86 y.o.   MRN: [id number] Chief Complaint  Patient presents with  . Follow-up    pe phone note    The patient returns to the office for followup and review of the noninvasive studies. There have been no interval changes in lower extremity symptoms. No interval shortening of the patient's claudication distance or development of rest pain symptoms. No new ulcers or wounds have occurred since the last visit.  There have been no significant changes to the patient's overall health care.  The patient does report having a noise in her ears that sounds as if her heartbeat is beating in her ears.  This happens bilaterally and it is more prominent when she lays down at night.  The patient has known carotid artery stenosis with previous studies in 2018 showing a 40 to 59% internal carotid artery stenosis bilaterally.  The patient denies amaurosis fugax or recent TIA symptoms. There are no recent neurological changes noted. The patient denies history of DVT, PE or superficial thrombophlebitis. The patient denies recent episodes of angina or shortness of breath.   ABI Rt=0.93 and Lt=0.92  (previous ABI's Rt=0.94 and Lt=0.99) Duplex ultrasound of the bilateral tibial arteries reveals triphasic waveforms with good toe waveforms bilaterally.   Review of Systems  Neurological:       Balance issues  All other systems reviewed and are negative.      Objective:   Physical Exam Vitals reviewed.  Neck:     Vascular: No carotid bruit.  Cardiovascular:     Rate and Rhythm: Normal rate and regular rhythm.     Pulses: Normal pulses.     Heart sounds: Normal heart sounds.  Pulmonary:     Effort: Pulmonary effort is normal.     Breath sounds: Normal breath sounds.  Skin:    General: Skin is warm and dry.     Capillary Refill: Capillary refill takes less than 2 seconds.  Neurological:     Mental Status: She is alert and  oriented to person, place, and time.  Psychiatric:        Mood and Affect: Mood normal.        Behavior: Behavior normal.        Thought Content: Thought content normal.        Judgment: Judgment normal.     BP (!) 179/68   Pulse 94   Ht 5' (1.524 m)   Wt 108 lb (49 kg)   BMI 21.09 kg/m   Past Medical History:  Diagnosis Date  . Anemia   . Aneurysm ([place]) 1967   removed yrs ago - craniotomy  . Arthritis    fingers, "all over"  . Diabetes mellitus without complication ([place])   . Full dentures   . GERD (gastroesophageal reflux disease)   . Gout   . Heart murmur    followed by PCP  . History of hiatal hernia    "repaired"  . Hypertension   . Hypothyroidism   . Legal blindness of left eye, as defined in [place]   . Varicose veins of both lower extremities     Social History   Socioeconomic History  . Marital status: Widowed    Spouse name: Not on file  . Number of children: Not on file  . Years of education: Not on file  . Highest education level: Not on file  Occupational History  . Not on file  Tobacco Use  . Smoking status: Former Research scientist (life sciences)  . Smokeless tobacco: Never Used  . Tobacco comment: quit 20+ yrs ago  Substance and Sexual Activity  . Alcohol use: No  . Drug use: Never  . Sexual activity: Not on file  Other Topics Concern  . Not on file  Social History Narrative  . Not on file   Social Determinants of Health   Financial Resource Strain:   . Difficulty of Paying Living Expenses:   Food Insecurity:   . Worried About Charity fundraiser in the Last Year:   . Arboriculturist in the Last Year:   Transportation Needs:   . Film/video editor (Medical):   Marland Kitchen Lack of Transportation (Non-Medical):   Physical Activity:   . Days of Exercise per Week:   . Minutes of Exercise per Session:   Stress:   . Feeling of Stress :   Social Connections:   . Frequency of Communication with Friends and Family:   . Frequency of Social Gatherings with Friends and  Family:   . Attends Religious Services:   . Active Member of Clubs or Organizations:   . Attends Archivist Meetings:   Marland Kitchen Marital Status:   Intimate Partner Violence:   . Fear of Current or Ex-Partner:   . Emotionally Abused:   Marland Kitchen Physically Abused:   . Sexually Abused:     Past Surgical History:  Procedure Laterality Date  . ABDOMINAL HYSTERECTOMY    . APPENDECTOMY    . CATARACT EXTRACTION W/PHACO Left 09/17/2015   Procedure: CATARACT EXTRACTION PHACO AND INTRAOCULAR LENS PLACEMENT (IOC);  Surgeon: Leandrew Koyanagi, MD;  Location: [place];  Service: Ophthalmology;  Laterality: Left;  DIABETIC - insulin and oral meds  . CATARACT EXTRACTION W/PHACO Right 10/22/2015   Procedure: CATARACT EXTRACTION PHACO AND INTRAOCULAR LENS PLACEMENT (IOC);  Surgeon: Leandrew Koyanagi, MD;  Location: [place];  Service: Ophthalmology;  Laterality: Right;  DIABETIC - insulin and oral meds  . CERVICAL DISC SURGERY    . CHOLECYSTECTOMY    . COLONOSCOPY    . COLONOSCOPY WITH PROPOFOL N/A 12/10/2019   Procedure: COLONOSCOPY WITH BIOPSY;  Surgeon: Lucilla Lame, MD;  Location: [place];  Service: Endoscopy;  Laterality: N/A;  diabetic  . CRANIOTOMY  1967  . ESOPHAGEAL DILATION  01/02/2018   Procedure: ESOPHAGEAL DILATION;  Surgeon: Lucilla Lame, MD;  Location: [place];  Service: Endoscopy;;  . ESOPHAGOGASTRODUODENOSCOPY (EGD) WITH PROPOFOL N/A 01/02/2018   Procedure: ESOPHAGOGASTRODUODENOSCOPY (EGD) WITH PROPOFOL;  Surgeon: Lucilla Lame, MD;  Location: [place];  Service: Endoscopy;  Laterality: N/A;  DIABETIC  . ESOPHAGOGASTRODUODENOSCOPY (EGD) WITH PROPOFOL N/A 12/10/2019   Procedure: ESOPHAGOGASTRODUODENOSCOPY (EGD) WITH PROPOFOL;  Surgeon: Lucilla Lame, MD;  Location: [place];  Service: Endoscopy;  Laterality: N/A;  priority 3  . hiatel hernia      nissen fundiplication  . POLYPECTOMY N/A 12/10/2019   Procedure: POLYPECTOMY;   Surgeon: Lucilla Lame, MD;  Location: [place];  Service: Endoscopy;  Laterality: N/A;  . RECTAL SURGERY      Family History  Problem Relation Age of Onset  . Varicose Veins Neg Hx   . Vision loss Neg Hx     Allergies  Allergen Reactions  . Neomycin Rash  . Zantac [Ranitidine Hcl] Hives       . Penicillin G Rash       Assessment & Plan:   1. PAD (peripheral artery disease) ([place])  Recommend:  The patient has evidence of atherosclerosis of the lower extremities.  The patient does not voice lifestyle limiting changes at this point in time.  Noninvasive studies do not suggest clinically significant change.  No invasive studies, angiography or surgery at this time The patient should continue walking and begin a more formal exercise program.  The patient should continue antiplatelet therapy and aggressive treatment of the lipid abnormalities  No changes in the patient's medications at this time  The patient should continue wearing graduated compression socks 10-15 mmHg strength to control the mild edema.   Based on the patient's noninvasive studies, we had the patient return in 2 years with repeat studies.  However the patient is advised to follow-up sooner if she develops wounds or ulcerations or has new severe claudication pain.  2. Essential hypertension Continue antihypertensive medications as already ordered, these medications have been reviewed and there are no changes at this time.   3. Bilateral carotid artery stenosis The patient reports hearing a beating her heartbeat noise in her ears especially when she lays down during the evening.  Previous carotid studies done in 2018 show a 40 to 59% stenosis bilaterally.  I discussed with the patient that we should likely get a updated carotid duplex to evaluate her carotid stenosis given this finding.  However the patient would like to discuss with her primary care provider to decide if it is something necessary.   Patient will contact her office if she decides that she wants to have her carotid artery duplex.  4. Hyperlipidemia due to type 2 diabetes mellitus ([place]) Continue statin as ordered and reviewed, no changes at this time    Current Outpatient Medications on File Prior to Visit  Medication Sig Dispense Refill  . allopurinol (ZYLOPRIM) 100 MG tablet Take 100 mg by mouth daily.    . clopidogrel (PLAVIX) 75 MG tablet Take 75 mg by mouth daily.    . diazepam (VALIUM) 5 MG tablet Take 5 mg by mouth as needed for anxiety.    . Ferrous Sulfate (IRON SUPPLEMENT PO) Take by mouth daily.    . hydrochlorothiazide (HYDRODIURIL) 25 MG tablet Take 25 mg by mouth daily.    . insulin aspart (NOVOLOG FLEXPEN) 100 UNIT/ML FlexPen Inject into the skin 3 (three) times daily with meals.    . insulin aspart protamine- aspart (NOVOLOG MIX 70/30) (70-30) 100 UNIT/ML injection Inject 6 Units into the skin daily with breakfast.    . levothyroxine (SYNTHROID) 25 MCG tablet Take 25 mcg by mouth daily before breakfast.    . loratadine (CLARITIN) 5 MG chewable tablet Chew 5 mg by mouth daily.    Marland Kitchen losartan (COZAAR) 100 MG tablet Take 25 mg by mouth daily.     . metFORMIN (GLUCOPHAGE) 500 MG tablet Take by mouth 2 (two) times daily. 1 tab AM, 2 tabs PM    . Na Sulfate-K Sulfate-Mg Sulf (SUPREP BOWEL PREP KIT) 17.5-3.13-1.6 GM/177ML SOLN Take 1 kit by mouth as directed. 354 mL 0  . Na Sulfate-K Sulfate-Mg Sulf (SUPREP BOWEL PREP KIT) 17.5-3.13-1.6 GM/177ML SOLN Take 1 kit by mouth as directed. 354 mL 0  . NIFEdipine (PROCARDIA XL/ADALAT-CC) 60 MG 24 hr tablet Take 60 mg by mouth daily.    Marland Kitchen omeprazole (PRILOSEC) 40 MG capsule Take 40 mg by mouth daily.    . predniSONE (DELTASONE) 10 MG tablet Take 2.5 mg by mouth every other day.     . sertraline (ZOLOFT) 25 MG tablet Take 25 mg  by mouth daily.    . Vitamin D, Ergocalciferol, (DRISDOL) 50000 units CAPS capsule TAKE 1 CAPSULE BY MOUTH EVERY 14 DAYS    . pravastatin  (PRAVACHOL) 10 MG tablet Take by mouth.     No current facility-administered medications on file prior to visit.    There are no Patient Instructions on file for this visit. No follow-ups on file.   Kris Hartmann, NP

## 2022-02-08 ENCOUNTER — Other Ambulatory Visit (INDEPENDENT_AMBULATORY_CARE_PROVIDER_SITE_OTHER): Payer: Self-pay | Admitting: Nurse Practitioner

## 2022-02-08 DIAGNOSIS — I739 Peripheral vascular disease, unspecified: Secondary | ICD-10-CM

## 2022-02-09 ENCOUNTER — Ambulatory Visit (INDEPENDENT_AMBULATORY_CARE_PROVIDER_SITE_OTHER): Payer: Medicare Other | Admitting: Vascular Surgery

## 2022-02-09 ENCOUNTER — Ambulatory Visit (INDEPENDENT_AMBULATORY_CARE_PROVIDER_SITE_OTHER): Payer: Medicare Other

## 2022-02-09 ENCOUNTER — Encounter (INDEPENDENT_AMBULATORY_CARE_PROVIDER_SITE_OTHER): Payer: Self-pay | Admitting: Vascular Surgery

## 2022-02-09 VITALS — BP 167/68 | HR 89 | Resp 16 | Ht 60.0 in | Wt 113.0 lb

## 2022-02-09 DIAGNOSIS — I739 Peripheral vascular disease, unspecified: Secondary | ICD-10-CM

## 2022-02-09 DIAGNOSIS — I10 Essential (primary) hypertension: Secondary | ICD-10-CM

## 2022-02-09 DIAGNOSIS — M79605 Pain in left leg: Secondary | ICD-10-CM

## 2022-02-09 DIAGNOSIS — I6523 Occlusion and stenosis of bilateral carotid arteries: Secondary | ICD-10-CM | POA: Diagnosis not present

## 2022-02-09 DIAGNOSIS — E785 Hyperlipidemia, unspecified: Secondary | ICD-10-CM

## 2022-02-09 DIAGNOSIS — M79604 Pain in right leg: Secondary | ICD-10-CM

## 2022-02-09 NOTE — Progress Notes (Signed)
MRN : [id number]  Nancy Green is a 88 y.o. (Mar 22, 1930) female who presents with chief complaint of  Chief Complaint  Patient presents with   Follow-up    ultrasound  .  History of Present Illness: Patient returns today in follow up of her PAD.  She is doing well.  She has balance issues but no claudication, rest pain, or ulceration.  ABIs today are both in the normal range at 1.15 on the right and 1.07 on the left with multiphasic waveforms.  Previously, the right side was mildly reduced on studies a few years ago.  Current Outpatient Medications  Medication Sig Dispense Refill   alendronate (FOSAMAX) 70 MG tablet Take by mouth.     allopurinol (ZYLOPRIM) 100 MG tablet Take 100 mg by mouth daily.     clopidogrel (PLAVIX) 75 MG tablet Take 75 mg by mouth daily.     diazepam (VALIUM) 5 MG tablet Take 5 mg by mouth as needed for anxiety.     Ferrous Sulfate (IRON SUPPLEMENT PO) Take by mouth daily.     hydrochlorothiazide (HYDRODIURIL) 25 MG tablet Take 25 mg by mouth daily.     levothyroxine (SYNTHROID) 25 MCG tablet Take 25 mcg by mouth daily before breakfast.     loratadine (CLARITIN) 5 MG chewable tablet Chew 5 mg by mouth daily.     losartan (COZAAR) 100 MG tablet Take 25 mg by mouth daily.      metFORMIN (GLUCOPHAGE) 500 MG tablet Take by mouth 2 (two) times daily. 1 tab AM, 2 tabs PM     NIFEdipine (PROCARDIA XL/ADALAT-CC) 60 MG 24 hr tablet Take 60 mg by mouth daily.     omeprazole (PRILOSEC) 40 MG capsule Take 40 mg by mouth daily.     Vitamin D, Ergocalciferol, (DRISDOL) 50000 units CAPS capsule TAKE 1 CAPSULE BY MOUTH EVERY 14 DAYS     insulin aspart (NOVOLOG FLEXPEN) 100 UNIT/ML FlexPen Inject into the skin 3 (three) times daily with meals. (Patient not taking: Reported on 02/09/2022)     insulin aspart protamine- aspart (NOVOLOG MIX 70/30) (70-30) 100 UNIT/ML injection Inject 6 Units into the skin daily with breakfast. (Patient not taking: Reported on 02/09/2022)      Na Sulfate-K Sulfate-Mg Sulf (SUPREP BOWEL PREP KIT) 17.5-3.13-1.6 GM/177ML SOLN Take 1 kit by mouth as directed. (Patient not taking: Reported on 02/09/2022) 354 mL 0   Na Sulfate-K Sulfate-Mg Sulf (SUPREP BOWEL PREP KIT) 17.5-3.13-1.6 GM/177ML SOLN Take 1 kit by mouth as directed. (Patient not taking: Reported on 02/09/2022) 354 mL 0   pravastatin (PRAVACHOL) 10 MG tablet Take by mouth.     predniSONE (DELTASONE) 10 MG tablet Take 2.5 mg by mouth every other day.  (Patient not taking: Reported on 02/09/2022)     sertraline (ZOLOFT) 25 MG tablet Take 25 mg by mouth daily. (Patient not taking: Reported on 02/09/2022)     No current facility-administered medications for this visit.    Past Medical History:  Diagnosis Date   Anemia    Aneurysm ([place]) 1967   removed yrs ago - craniotomy   Arthritis    fingers, "all over"   Diabetes mellitus without complication ([facility])    Full dentures    GERD (gastroesophageal reflux disease)    Gout    Heart murmur    followed by PCP   History of hiatal hernia    "repaired"   Hypertension    Hypothyroidism    Legal blindness of left eye,  as defined in [place]    Varicose veins of both lower extremities     Past Surgical History:  Procedure Laterality Date   ABDOMINAL HYSTERECTOMY     APPENDECTOMY     CATARACT EXTRACTION W/PHACO Left 09/17/2015   Procedure: CATARACT EXTRACTION PHACO AND INTRAOCULAR LENS PLACEMENT (IOC);  Surgeon: Leandrew Koyanagi, MD;  Location: [place];  Service: Ophthalmology;  Laterality: Left;  DIABETIC - insulin and oral meds   CATARACT EXTRACTION W/PHACO Right 10/22/2015   Procedure: CATARACT EXTRACTION PHACO AND INTRAOCULAR LENS PLACEMENT (IOC);  Surgeon: Leandrew Koyanagi, MD;  Location: [place];  Service: Ophthalmology;  Laterality: Right;  DIABETIC - insulin and oral meds   CERVICAL DISC SURGERY     CHOLECYSTECTOMY     COLONOSCOPY     COLONOSCOPY WITH PROPOFOL N/A 12/10/2019   Procedure:  COLONOSCOPY WITH BIOPSY;  Surgeon: Lucilla Lame, MD;  Location: [place];  Service: Endoscopy;  Laterality: N/A;  diabetic   [place]  01/02/2018   Procedure: ESOPHAGEAL DILATION;  Surgeon: Lucilla Lame, MD;  Location: [place];  Service: Endoscopy;;   ESOPHAGOGASTRODUODENOSCOPY (EGD) WITH PROPOFOL N/A 01/02/2018   Procedure: ESOPHAGOGASTRODUODENOSCOPY (EGD) WITH PROPOFOL;  Surgeon: Lucilla Lame, MD;  Location: [place];  Service: Endoscopy;  Laterality: N/A;  DIABETIC   ESOPHAGOGASTRODUODENOSCOPY (EGD) WITH PROPOFOL N/A 12/10/2019   Procedure: ESOPHAGOGASTRODUODENOSCOPY (EGD) WITH PROPOFOL;  Surgeon: Lucilla Lame, MD;  Location: [place];  Service: Endoscopy;  Laterality: N/A;  priority 3   hiatel hernia      nissen fundiplication   POLYPECTOMY N/A 12/10/2019   Procedure: POLYPECTOMY;  Surgeon: Lucilla Lame, MD;  Location: [place];  Service: Endoscopy;  Laterality: N/A;   RECTAL SURGERY       Social History   Tobacco Use   Smoking status: Former   Smokeless tobacco: Never   Tobacco comments:    quit 20+ yrs ago  Vaping Use   Vaping Use: Never used  Substance Use Topics   Alcohol use: No   Drug use: Never      Family History  Problem Relation Age of Onset   Varicose Veins Neg Hx    Vision loss Neg Hx      Allergies  Allergen Reactions   Neomycin Rash   Zantac [Ranitidine Hcl] Hives        Penicillin G Rash    REVIEW OF SYSTEMS (Negative unless checked)   Constitutional: []Weight loss  []Fever  []Chills Cardiac: []Chest pain   []Chest pressure   []Palpitations   []Shortness of breath when laying flat   []Shortness of breath at rest   []Shortness of breath with exertion. Vascular:  []Pain in legs with walking   []Pain in legs at rest   []Pain in legs when laying flat   []Claudication   []Pain in feet when walking  []Pain in feet at rest  []Pain in feet when laying flat   []History of DVT    []Phlebitis   []Swelling in legs   []Varicose veins   []Non-healing ulcers Pulmonary:   []Uses home oxygen   []Productive cough   []Hemoptysis   []Wheeze  []COPD   []Asthma Neurologic:  [x]Dizziness  []Blackouts   []Seizures   []History of stroke   []History of TIA  []Aphasia   []Temporary blindness   []Dysphagia   []Weakness or numbness in arms   []Weakness or numbness in legs Musculoskeletal:  [x]Arthritis   []Joint swelling   []  Joint pain   []Low back pain Hematologic:  []Easy bruising  []Easy bleeding   []Hypercoagulable state   []Anemic  []Hepatitis Gastrointestinal:  []Blood in stool   []Vomiting blood  []Gastroesophageal reflux/heartburn   []Abdominal pain Genitourinary:  []Chronic kidney disease   []Difficult urination  []Frequent urination  []Burning with urination   []Hematuria Skin:  []Rashes   []Ulcers   []Wounds Psychological:  []History of anxiety   [] History of major depression.  Physical Examination  BP (!) 167/68 (BP Location: Left Arm)   Pulse 89   Resp 16   Ht 5' (1.524 m)   Wt 113 lb (51.3 kg)   BMI 22.07 kg/m  Gen:  WD/WN, NAD Head: [place]/AT, No temporalis wasting. Ear/Nose/Throat: Hearing grossly intact, nares w/o erythema or drainage Eyes: Conjunctiva clear. Sclera non-icteric Neck: Supple.  Trachea midline Pulmonary:  Good air movement, no use of accessory muscles.  Cardiac: irregular Vascular:  Vessel Right Left  Radial Palpable Palpable                          PT Palpable Palpable  DP Palpable Palpable   Gastrointestinal: soft, non-tender/non-distended. No guarding/reflex.  Musculoskeletal: M/S 5/5 throughout.  No deformity or atrophy. Walks with a cane. No edema. Neurologic: Sensation grossly intact in extremities.  Symmetrical.  Speech is fluent.  Psychiatric: Judgment intact, Mood & affect appropriate for pt's clinical situation. Dermatologic: No rashes or ulcers noted.  No cellulitis or open wounds.     Labs No results found for this or any  previous visit (from the past 2160 hour(s)).  Radiology No results found.  Assessment/Plan Hyperlipidemia lipid control important in reducing the progression of atherosclerotic disease. Continue statin therapy     Leg pain Given the findings of her arterial studies, more than likely neurogenic in nature.   PAD (peripheral artery disease) ([facility]) ABIs today are both in the normal range at 1.15 on the right and 1.07 on the left with multiphasic waveforms.  Previously, the right side was mildly reduced on studies a few years ago. Perfusion is stable.  This can be checked every other year with noninvasive studies at this point.   Leotis Pain, MD  02/09/2022 12:06 PM    This note was created with Dragon medical transcription system.  Any errors from dictation are purely unintentional

## 2022-08-29 ENCOUNTER — Encounter: Payer: Self-pay | Admitting: Emergency Medicine

## 2022-08-29 ENCOUNTER — Ambulatory Visit
Admission: EM | Admit: 2022-08-29 | Discharge: 2022-08-29 | Disposition: A | Discharge status: Home / Self Care | Payer: Medicare Other | Attending: Family Medicine | Admitting: Family Medicine

## 2022-08-29 DIAGNOSIS — N3001 Acute cystitis with hematuria: Secondary | ICD-10-CM | POA: Diagnosis present

## 2022-08-29 LAB — URINALYSIS, ROUTINE W REFLEX MICROSCOPIC
Bilirubin Urine: NEGATIVE
Glucose, UA: NEGATIVE mg/dL
Nitrite: NEGATIVE
Protein, ur: 100 mg/dL — AB
Specific Gravity, Urine: 1.015 (ref 1.005–1.030)
pH: 5.5 (ref 5.0–8.0)

## 2022-08-29 LAB — URINALYSIS, MICROSCOPIC (REFLEX): WBC, UA: >50 WBC/hpf (ref 0–5)

## 2022-08-29 MED ORDER — SULFAMETHOXAZOLE-TRIMETHOPRIM 800-160 MG PO TABS: 1.0000 | ORAL_TABLET | Freq: Two times a day (BID) | ORAL | 0 refills | Status: AC

## 2022-08-29 NOTE — ED Provider Notes (Signed)
[facility]    CSN: [id number] Arrival date & time: 08/29/22  1428      History   Chief Complaint Chief Complaint  Patient presents with   Dysuria     HPI HPI Nancy Green is a 88 y.o. female.    Nancy Green presents for dysuria with urinary frequency and some urinary urgency that started yesterday.  Symptoms of gotten worse today.  Denies any fever, blood in her urine, vaginal discharge, nausea, vomiting, belly pain or new back pain.        Past Medical History:  Diagnosis Date   Anemia    Aneurysm ([place]) 1967   removed yrs ago - craniotomy   Arthritis    fingers, "all over"   Diabetes mellitus without complication (HCC)    Full dentures    GERD (gastroesophageal reflux disease)    Gout    Heart murmur    followed by PCP   History of hiatal hernia    "repaired"   Hypertension    Hypothyroidism    Legal blindness of left eye, as defined in [place]    Varicose veins of both lower extremities     Patient Active Problem List   Diagnosis Date Noted   Iron deficiency anemia secondary to blood loss (chronic)    Rectal polyp    Anemia 06/18/2019   Problems with swallowing and mastication    Stricture and stenosis of esophagus    Leg pain 02/25/2017   PAD (peripheral artery disease) ([place]) 02/25/2017   Hyperlipidemia 02/10/2017   Diabetes ([place]) 08/31/2016   Dizziness and giddiness 08/31/2016   Carotid stenosis 08/31/2016   Temporal arteritis ([place]) 04/21/2015   IDA (iron deficiency anemia) 12/26/2014   Microalbuminuria 02/14/2014   Vitamin D deficiency, unspecified 02/14/2014   Abnormal weight loss 12/17/2013   Poor balance 12/17/2013   Weakness 12/17/2013   Thrombocytopenia ([place]) 11/25/2013   Essential hypertension 06/09/2012   Allergic rhinitis, seasonal 06/29/2011   Anxiety 06/29/2011   GERD (gastroesophageal reflux disease) 06/29/2011   Gout attack 06/29/2011   History of stroke 06/29/2011   Mitral insufficiency  06/29/2011   Nodule of right lung 06/29/2011   Osteoporosis, post-menopausal 06/29/2011   Hyperlipidemia due to type 2 diabetes mellitus ([place]) 06/29/2011    Past Surgical History:  Procedure Laterality Date   ABDOMINAL HYSTERECTOMY     APPENDECTOMY     CATARACT EXTRACTION W/PHACO Left 09/17/2015   Procedure: CATARACT EXTRACTION PHACO AND INTRAOCULAR LENS PLACEMENT ([place]);  Surgeon: Leandrew Koyanagi, MD;  Location: [place];  Service: Ophthalmology;  Laterality: Left;  DIABETIC - insulin and oral meds   CATARACT EXTRACTION W/PHACO Right 10/22/2015   Procedure: CATARACT EXTRACTION PHACO AND INTRAOCULAR LENS PLACEMENT (I[facility]);  Surgeon: Leandrew Koyanagi, MD;  Location: [place];  Service: Ophthalmology;  Laterality: Right;  DIABETIC - insulin and oral meds   CERVICAL DISC SURGERY     CHOLECYSTECTOMY     COLONOSCOPY     COLONOSCOPY WITH PROPOFOL N/A 12/10/2019   Procedure: COLONOSCOPY WITH BIOPSY;  Surgeon: Lucilla Lame, MD;  Location: [place];  Service: Endoscopy;  Laterality: N/A;  diabetic   [place]  01/02/2018   Procedure: ESOPHAGEAL DILATION;  Surgeon: Lucilla Lame, MD;  Location: [place];  Service: Endoscopy;;   ESOPHAGOGASTRODUODENOSCOPY (EGD) WITH PROPOFOL N/A 01/02/2018   Procedure: ESOPHAGOGASTRODUODENOSCOPY (EGD) WITH PROPOFOL;  Surgeon: Lucilla Lame, MD;  Location: [place];  Service: Endoscopy;  Laterality: N/A;  DIABETIC   ESOPHAGOGASTRODUODENOSCOPY (EGD) WITH PROPOFOL N/A 12/10/2019   Procedure: ESOPHAGOGASTRODUODENOSCOPY (EGD) WITH PROPOFOL;  Surgeon: Lucilla Lame, MD;  Location: [place];  Service: Endoscopy;  Laterality: N/A;  priority 3   hiatel hernia      nissen fundiplication   POLYPECTOMY N/A 12/10/2019   Procedure: POLYPECTOMY;  Surgeon: Lucilla Lame, MD;  Location: [place];  Service: Endoscopy;  Laterality: N/A;   RECTAL SURGERY      OB History   No obstetric  history on file.      Home Medications    Prior to Admission medications   Medication Sig Start Date End Date Taking? Authorizing Provider  alendronate (FOSAMAX) 70 MG tablet Take 70 mg by mouth once a week. Take with a full glass of water on an empty stomach.   Yes [provider]  allopurinol (ZYLOPRIM) 100 MG tablet Take 100 mg by mouth daily.   Yes [provider]  clopidogrel (PLAVIX) 75 MG tablet Take 75 mg by mouth daily.   Yes [provider]  hydrochlorothiazide (HYDRODIURIL) 25 MG tablet Take 25 mg by mouth daily.   Yes [provider]  levothyroxine (SYNTHROID) 25 MCG tablet Take 25 mcg by mouth daily before breakfast.   Yes [provider]  losartan (COZAAR) 100 MG tablet Take 25 mg by mouth daily.    Yes [provider]  metFORMIN (GLUCOPHAGE) 500 MG tablet Take by mouth 2 (two) times daily. 1 tab AM, 2 tabs PM   Yes [provider]  NIFEdipine (PROCARDIA XL/ADALAT-CC) 60 MG 24 hr tablet Take 60 mg by mouth daily.   Yes [provider]  omeprazole (PRILOSEC) 40 MG capsule Take 40 mg by mouth daily.   Yes [provider]  sulfamethoxazole-trimethoprim (BACTRIM DS) 800-160 MG tablet Take 1 tablet by mouth 2 (two) times daily for 7 days. 08/29/22 09/05/22 Yes Martino Tompson, DO  diazepam (VALIUM) 5 MG tablet Take 5 mg by mouth as needed for anxiety.    [provider]  Ferrous Sulfate (IRON SUPPLEMENT PO) Take by mouth daily.    [provider]  insulin aspart (NOVOLOG FLEXPEN) 100 UNIT/ML FlexPen Inject into the skin 3 (three) times daily with meals. Patient not taking: Reported on 02/09/2022    [provider]  insulin aspart protamine- aspart (NOVOLOG MIX 70/30) (70-30) 100 UNIT/ML injection Inject 6 Units into the skin daily with breakfast. Patient not taking: Reported on 02/09/2022    [provider]  loratadine (CLARITIN) 5 MG chewable tablet Chew 5 mg by mouth  daily.    [provider]  Na Sulfate-K Sulfate-Mg Sulf (SUPREP BOWEL PREP KIT) 17.5-3.13-1.6 GM/177ML SOLN Take 1 kit by mouth as directed. Patient not taking: Reported on 02/09/2022 07/11/19   Lucilla Lame, MD  Na Sulfate-K Sulfate-Mg Sulf (SUPREP BOWEL PREP KIT) 17.5-3.13-1.6 GM/177ML SOLN Take 1 kit by mouth as directed. Patient not taking: Reported on 02/09/2022 11/19/19   Lucilla Lame, MD  pravastatin (PRAVACHOL) 10 MG tablet Take by mouth. 12/21/16 12/10/19  [provider]  predniSONE (DELTASONE) 10 MG tablet Take 2.5 mg by mouth every other day.  Patient not taking: Reported on 02/09/2022    [provider]  sertraline (ZOLOFT) 25 MG tablet Take 25 mg by mouth daily. Patient not taking: Reported on 02/09/2022 01/10/20   [provider]  Vitamin D, Ergocalciferol, (DRISDOL) 50000 units CAPS capsule TAKE 1 CAPSULE BY MOUTH EVERY 14 DAYS 03/30/17   [provider]  Family History Family History  Problem Relation Age of Onset   Varicose Veins Neg Hx    Vision loss Neg Hx     Social History Social History   Tobacco Use   Smoking status: Former   Smokeless tobacco: Never   Tobacco comments:    quit 20+ yrs ago  Vaping Use   Vaping Use: Never used  Substance Use Topics   Alcohol use: No   Drug use: Never     Allergies   Neomycin, Zantac [ranitidine hcl], and Penicillin g   Review of Systems Review of Systems: :negative unless otherwise stated in HPI.      Physical Exam Triage Vital Signs ED Triage Vitals  Enc Vitals Group     BP 08/29/22 1543 (!) 145/62     Pulse Rate 08/29/22 1543 96     Resp 08/29/22 1543 14     Temp 08/29/22 1543 97.7 F (36.5 C)     Temp Source 08/29/22 1543 Oral     SpO2 08/29/22 1543 99 %     Weight 08/29/22 1538 113 lb 1.5 oz (51.3 kg)     Height 08/29/22 1538 5' (1.524 m)     Head Circumference --      Peak Flow --      Pain Score 08/29/22 1538 0     Pain Loc --      Pain Edu? --      Excl.  in [place]? --    No data found.  Updated Vital Signs BP (!) 145/62 (BP Location: Left Arm)   Pulse 96   Temp 97.7 F (36.5 C) (Oral)   Resp 14   Ht 5' (1.524 m)   Wt 51.3 kg   SpO2 99%   BMI 22.09 kg/m   Visual Acuity Right Eye Distance:   Left Eye Distance:   Bilateral Distance:    Right Eye Near:   Left Eye Near:    Bilateral Near:     Physical Exam GEN: well appearing female in no acute distress  CVS: well perfused  RESP: speaking in full sentences without pause  ABD: soft, non-tender, non-distended, no palpable masses   SKIN: s/p Mohs top of scalp that is well healing    UC Treatments / Results  Labs (all labs ordered are listed, but only abnormal results are displayed) Labs Reviewed  URINALYSIS, ROUTINE W REFLEX MICROSCOPIC - Abnormal; Notable for the following components:      Result Value   APPearance HAZY (*)    Hgb urine dipstick TRACE (*)    Ketones, ur TRACE (*)    Protein, ur 100 (*)    Leukocytes,Ua LARGE (*)    All other components within normal limits  URINALYSIS, MICROSCOPIC (REFLEX) - Abnormal; Notable for the following components:   Bacteria, UA MANY (*)    All other components within normal limits  URINE CULTURE    EKG   Radiology No results found.  Procedures Procedures (including critical care time)  Medications Ordered in UC Medications - No data to display  Initial Impression / Assessment and Plan / UC Course  I have reviewed the triage vital signs and the nursing notes.  Pertinent labs & imaging results that were available during my care of the patient were reviewed by me and considered in my medical decision making (see chart for details).      Patient is a 88 y.o.Marland Kitchen female  who presents for dysuria since yesterday overall patient is well-appearing and afebrile.  Vital signs stable.  UA consistent with acute cystitis.   Hematuria supported on microscopy and patient is postmenopausal.  Treat with Bactrim as below. Urine culture  obtained.  Follow-up sensitivities and change antibiotics, if needed. Return precautions including abdominal pain, fever, chills, nausea, or vomiting given.   Discussed MDM, treatment plan and plan for follow-up with patient/parent who agrees with plan.        Final Clinical Impressions(s) / UC Diagnoses   Final diagnoses:  Acute cystitis with hematuria     Discharge Instructions      You had evidence of of a bacterial infection in your urine today.  Stop by the pharmacy to pick up your prescriptions.  For your UTI: Take Bactrim twice a day for the next 7 days.  If your symptoms do not improve in the next 7 days, be sure to follow-up here or at your primary care provider office.  Go to the emergency department if you are having increasing pain, worsening vaginal bleeding or fever.     ED Prescriptions     Medication Sig Dispense Auth. Provider   sulfamethoxazole-trimethoprim (BACTRIM DS) 800-160 MG tablet Take 1 tablet by mouth 2 (two) times daily for 7 days. 14 tablet Adante Courington, Ronnette Juniper, DO      PDMP not reviewed this encounter.   Lyndee Hensen, DO 08/29/22 1650

## 2022-08-29 NOTE — Discharge Instructions (Addendum)
You had evidence of of a bacterial infection in your urine today.  Stop by the pharmacy to pick up your prescriptions.  For your UTI: Take Bactrim twice a day for the next 7 days.  If your symptoms do not improve in the next 7 days, be sure to follow-up here or at your primary care provider office.  Go to the emergency department if you are having increasing pain, worsening vaginal bleeding or fever.

## 2022-08-29 NOTE — ED Triage Notes (Signed)
Patient c/o burning when urinating that started yesterday but has gotten worse today.  Patient denies fevers.

## 2022-09-02 LAB — URINE CULTURE: Culture: 40000 — AB

## 2024-02-03 ENCOUNTER — Other Ambulatory Visit (INDEPENDENT_AMBULATORY_CARE_PROVIDER_SITE_OTHER): Payer: Self-pay | Admitting: Vascular Surgery

## 2024-02-03 DIAGNOSIS — I6523 Occlusion and stenosis of bilateral carotid arteries: Secondary | ICD-10-CM

## 2024-02-03 DIAGNOSIS — I739 Peripheral vascular disease, unspecified: Secondary | ICD-10-CM

## 2024-02-07 ENCOUNTER — Encounter (INDEPENDENT_AMBULATORY_CARE_PROVIDER_SITE_OTHER)

## 2024-02-07 ENCOUNTER — Ambulatory Visit (INDEPENDENT_AMBULATORY_CARE_PROVIDER_SITE_OTHER): Payer: BLUE CROSS/BLUE SHIELD | Admitting: Vascular Surgery

## 2024-02-07 ENCOUNTER — Encounter (INDEPENDENT_AMBULATORY_CARE_PROVIDER_SITE_OTHER): Payer: Medicare Other

## 2024-03-15 ENCOUNTER — Other Ambulatory Visit: Payer: Self-pay | Admitting: Family Medicine

## 2024-03-15 DIAGNOSIS — S3210XA Unspecified fracture of sacrum, initial encounter for closed fracture: Secondary | ICD-10-CM

## 2024-03-16 ENCOUNTER — Ambulatory Visit
Admission: RE | Admit: 2024-03-16 | Discharge: 2024-03-16 | Disposition: A | Discharge status: Home / Self Care | Source: Ambulatory Visit | Attending: Family Medicine | Admitting: Family Medicine

## 2024-03-16 DIAGNOSIS — S3210XA Unspecified fracture of sacrum, initial encounter for closed fracture: Secondary | ICD-10-CM

## 2024-04-16 ENCOUNTER — Ambulatory Visit (INDEPENDENT_AMBULATORY_CARE_PROVIDER_SITE_OTHER): Admitting: Surgery

## 2024-04-16 ENCOUNTER — Encounter: Payer: Self-pay | Admitting: Surgery

## 2024-04-16 VITALS — BP 176/71 | HR 97 | Temp 97.9°F | Ht 60.0 in | Wt 107.0 lb

## 2024-04-16 DIAGNOSIS — K4090 Unilateral inguinal hernia, without obstruction or gangrene, not specified as recurrent: Secondary | ICD-10-CM | POA: Diagnosis not present

## 2024-04-16 NOTE — Patient Instructions (Addendum)
 You will need to hold your Plavix  Prior to surgery for 1 week. Your last dose will be this Thursday 04/19/24    You have chose to have your hernia repaired. This will be done by Dr. Jordis at [facility].  If you are on any injectable weight loss medication, you will need to stop taking your GLP-1 injectable (weight loss) medications 8 days before your surgery to avoid any complications with anesthesia.   Please see your (blue) Pre-care information that you have been given today. Our surgery scheduler will call you to verify surgery date and to go over information.   You will need to arrange to be out of work for approximately 1-2 weeks and then you may return with a lifting restriction for 4 more weeks. If you have FMLA or Disability paperwork that needs to be filled out, please have your company fax your paperwork to [phone] or you may drop this by either office. This paperwork will be filled out within 3 days after your surgery has been completed.  You may have a bruise in your groin and also swelling and brusing in your testicle area. You may use ice 4-5 times daily for 15-20 minutes each time. Make sure that you place a barrier between you and the ice pack. To decrease the swelling, you may roll up a bath towel and place it vertically in between your thighs with your testicles resting on the towel. You will want to keep this area elevated as much as possible for several days following surgery.    Inguinal Hernia, Adult Muscles help keep everything in the body in its proper place. But if a weak spot in the muscles develops, something can poke through. That is called a hernia. When this happens in the lower part of the belly (abdomen), it is called an inguinal hernia. (It takes its name from a part of the body in this region called the inguinal canal.) A weak spot in the wall of muscles lets some fat or part of the small intestine bulge through. An inguinal hernia can develop at any age.  Men get them more often than women. CAUSES  In adults, an inguinal hernia develops over time. It can be triggered by: Suddenly straining the muscles of the lower abdomen. Lifting heavy objects. Straining to have a bowel movement. Difficult bowel movements (constipation) can lead to this. Constant coughing. This may be caused by smoking or lung disease. Being overweight. Being pregnant. Working at a job that requires long periods of standing or heavy lifting. Having had an inguinal hernia before. One type can be an emergency situation. It is called a strangulated inguinal hernia. It develops if part of the small intestine slips through the weak spot and cannot get back into the abdomen. The blood supply can be cut off. If that happens, part of the intestine may die. This situation requires emergency surgery. SYMPTOMS  Often, a small inguinal hernia has no symptoms. It is found when a healthcare provider does a physical exam. Larger hernias usually have symptoms.  In adults, symptoms may include: A lump in the groin. This is easier to see when the person is standing. It might disappear when lying down. In men, a lump in the scrotum. Pain or burning in the groin. This occurs especially when lifting, straining or coughing. A dull ache or feeling of pressure in the groin. Signs of a strangulated hernia can include: A bulge in the groin that becomes very painful and tender  to the touch. A bulge that turns red or purple. Fever, nausea and vomiting. Inability to have a bowel movement or to pass gas. DIAGNOSIS  To decide if you have an inguinal hernia, a healthcare provider will probably do a physical examination. This will include asking questions about any symptoms you have noticed. The healthcare provider might feel the groin area and ask you to cough. If an inguinal hernia is felt, the healthcare provider may try to slide it back into the abdomen. Usually no other tests are needed. TREATMENT   Treatments can vary. The size of the hernia makes a difference. Options include: Watchful waiting. This is often suggested if the hernia is small and you have had no symptoms. No medical procedure will be done unless symptoms develop. You will need to watch closely for symptoms. If any occur, contact your healthcare provider right away. Surgery. This is used if the hernia is larger or you have symptoms. Open surgery. This is usually an outpatient procedure (you will not stay overnight in a hospital). An cut (incision) is made through the skin in the groin. The hernia is put back inside the abdomen. The weak area in the muscles is then repaired by herniorrhaphy or hernioplasty. Herniorrhaphy: in this type of surgery, the weak muscles are sewn back together. Hernioplasty: a patch or mesh is used to close the weak area in the abdominal wall. Laparoscopy. In this procedure, a surgeon makes small incisions. A thin tube with a tiny video camera (called a laparoscope) is put into the abdomen. The surgeon repairs the hernia with mesh by looking with the video camera and using two long instruments. HOME CARE INSTRUCTIONS  After surgery to repair an inguinal hernia: You will need to take pain medicine prescribed by your healthcare provider. Follow all directions carefully. You will need to take care of the wound from the incision. Your activity will be restricted for awhile. This will probably include no heavy lifting for several weeks. You also should not do anything too active for a few weeks. When you can return to work will depend on the type of job that you have. During watchful waiting periods, you should: Maintain a healthy weight. Eat a diet high in fiber (fruits, vegetables and whole grains). Drink plenty of fluids to avoid constipation. This means drinking enough water  and other liquids to keep your urine clear or pale yellow. Do not lift heavy objects. Do not stand for long periods of  time. Quit smoking. This should keep you from developing a frequent cough. SEEK MEDICAL CARE IF:  A bulge develops in your groin area. You feel pain, a burning sensation or pressure in the groin. This might be worse if you are lifting or straining. You develop a fever of more than 100.5 F (38.1 C). SEEK IMMEDIATE MEDICAL CARE IF:  Pain in the groin increases suddenly. A bulge in the groin gets bigger suddenly and does not go down. For men, there is sudden pain in the scrotum. Or, the size of the scrotum increases. A bulge in the groin area becomes red or purple and is painful to touch. You have nausea or vomiting that does not go away. You feel your heart beating much faster than normal. You cannot have a bowel movement or pass gas. You develop a fever of more than 102.0 F (38.9 C).   This information is not intended to replace advice given to you by your health care provider. Make sure you discuss any questions you  have with your health care provider.   Document Released: 01/02/2009 Document Revised: 11/08/2011 Document Reviewed: 02/17/2015 Elsevier Interactive Patient Education [employer]

## 2024-04-16 NOTE — H&P (View-Only) (Signed)
 Surgical Consultation  04/16/2024  Nancy Green is an 89 y.o. female.   Chief Complaint  Patient presents with   New Patient (Initial Visit)    HPI: Nancy Green is a 89 year old female seen in consultation at the request of Dr.Feldpausch for symptomatic left inguinal hernia.  She reports intermittent left inguinal pain for the last few months.  The pain is sharp moderate intensity worsening with certain movements.  She did have a recent CT scan that I have personally reviewed showing evidence of a left inguinal hernia.  She did have a recent fall with a scalp laceration requiring repair.  She now has recovered.  She is independent and her mind is pristine.  She comes accompanied by her daughter.  She is competent.  She is able to walk with a cane and a walker at times.  She is still independent and receives daily home health from nurse. Prior abdominal operations include appendectomy, cholecystectomy, hysterectomy Past Medical History:  Diagnosis Date   Anemia    Aneurysm (HCC) 1967   removed yrs ago - craniotomy   Arthritis    fingers, all over   Diabetes mellitus without complication (HCC)    Full dentures    GERD (gastroesophageal reflux disease)    Gout    Heart murmur    followed by PCP   History of hiatal hernia    repaired   Hypertension    Hypothyroidism    Legal blindness of left eye, as defined in [place]    Varicose veins of both lower extremities     Past Surgical History:  Procedure Laterality Date   ABDOMINAL HYSTERECTOMY     APPENDECTOMY     CATARACT EXTRACTION W/PHACO Left 09/17/2015   Procedure: CATARACT EXTRACTION PHACO AND INTRAOCULAR LENS PLACEMENT (IOC);  Surgeon: Dene Etienne, MD;  Location: [facility];  Service: Ophthalmology;  Laterality: Left;  DIABETIC - insulin  and oral meds   CATARACT EXTRACTION W/PHACO Right 10/22/2015   Procedure: CATARACT EXTRACTION PHACO AND INTRAOCULAR LENS PLACEMENT (IOC);  Surgeon: Dene Etienne, MD;  Location: [facility];  Service: Ophthalmology;  Laterality: Right;  DIABETIC - insulin  and oral meds   CERVICAL DISC SURGERY     CHOLECYSTECTOMY     COLONOSCOPY     COLONOSCOPY WITH PROPOFOL  N/A 12/10/2019   Procedure: COLONOSCOPY WITH BIOPSY;  Surgeon: Jinny Carmine, MD;  Location: [facility];  Service: Endoscopy;  Laterality: N/A;  diabetic   CRANIOTOMY  1967   ESOPHAGEAL DILATION  01/02/2018   Procedure: ESOPHAGEAL DILATION;  Surgeon: Jinny Carmine, MD;  Location: [facility];  Service: Endoscopy;;   ESOPHAGOGASTRODUODENOSCOPY (EGD) WITH PROPOFOL  N/A 01/02/2018   Procedure: ESOPHAGOGASTRODUODENOSCOPY (EGD) WITH PROPOFOL ;  Surgeon: Jinny Carmine, MD;  Location: [facility];  Service: Endoscopy;  Laterality: N/A;  DIABETIC   ESOPHAGOGASTRODUODENOSCOPY (EGD) WITH PROPOFOL  N/A 12/10/2019   Procedure: ESOPHAGOGASTRODUODENOSCOPY (EGD) WITH PROPOFOL ;  Surgeon: Jinny Carmine, MD;  Location: [facility];  Service: Endoscopy;  Laterality: N/A;  priority 3   hiatel hernia      nissen fundiplication   POLYPECTOMY N/A 12/10/2019   Procedure: POLYPECTOMY;  Surgeon: Jinny Carmine, MD;  Location: [facility];  Service: Endoscopy;  Laterality: N/A;   RECTAL SURGERY      Family History  Problem Relation Age of Onset   Varicose Veins Neg Hx    Vision loss Neg Hx     Social History:  reports that she has quit smoking. She has never used smokeless tobacco.  She reports that she does not drink alcohol and does not use drugs.  Allergies:  Allergies  Allergen Reactions   Neomycin Rash   Zantac [Ranitidine Hcl] Hives        Penicillin G Rash    Medications reviewed.   ROS Full ROS performed and is otherwise negative other than what is stated in the HPI    BP (!) 176/71   Pulse 97   Temp 97.9 F (36.6 C) (Oral)   Ht 5' (1.524 m)   Wt 107 lb (48.5 kg)   SpO2 100%   BMI 20.90 kg/m   Physical Exam Vitals and nursing note reviewed.  Exam conducted with a chaperone present.  Constitutional:      General: She is not in acute distress.    Appearance: Normal appearance. She is not ill-appearing.  Cardiovascular:     Rate and Rhythm: Normal rate and regular rhythm.     Heart sounds: No murmur heard. Pulmonary:     Effort: Pulmonary effort is normal. No respiratory distress.     Breath sounds: Normal breath sounds. No stridor.  Abdominal:     General: Abdomen is flat. There is no distension.     Palpations: Abdomen is soft. There is no mass.     Tenderness: There is no abdominal tenderness. There is no guarding or rebound.     Hernia: A hernia is present.     Comments: Tender left inguinal hernia, reduces  Musculoskeletal:        General: No swelling or tenderness. Normal range of motion.     Cervical back: Normal range of motion and neck supple. No rigidity or tenderness.  Skin:    General: Skin is warm and dry.     Capillary Refill: Capillary refill takes less than 2 seconds.  Neurological:     General: No focal deficit present.     Mental Status: She is alert and oriented to person, place, and time.  Psychiatric:        Mood and Affect: Mood normal.        Behavior: Behavior normal.        Thought Content: Thought content normal.        Judgment: Judgment normal.       Assessment/Plan: 89 year old female with symptomatic left inguinal hernia with crescendo symptoms and significant quality-of-life issues.  She is nonagenarian and I do think that surgery is definitely indicated.  My experience with inguinal hernias in the elderly is that complications including strangulation are devastating and doing a semielective procedure is less risky than waiting for a catastrophic emergency surgery. I do think best procedure is open inguinal hernia repair. This will provide the least hemodynamic changes and probably be the most efficient method. She is on plavix  and we will need to hold this for a week.  She discussed with  the patient and the daughter in detail.  The risk the benefit of possible implications including but not limited to: Bleeding, infection, recurrence, chronic pain I personally spent a total of 60 minutes in the care of the patient today including performing a medically appropriate exam/evaluation, counseling and educating, placing orders, referring and communicating with other health care professionals, documenting clinical information in the EHR, independently interpreting and reviewing images studies and coordinating care.      Laneta Luna, MD [facility] General Surgeon

## 2024-04-16 NOTE — Progress Notes (Signed)
 Surgical Consultation  04/16/2024  Nancy Green is an 89 y.o. female.   Chief Complaint  Patient presents with   New Patient (Initial Visit)    HPI: Nancy Green is a 89 year old female seen in consultation at the request of Dr.Feldpausch for symptomatic left inguinal hernia.  She reports intermittent left inguinal pain for the last few months.  The pain is sharp moderate intensity worsening with certain movements.  She did have a recent CT scan that I have personally reviewed showing evidence of a left inguinal hernia.  She did have a recent fall with a scalp laceration requiring repair.  She now has recovered.  She is independent and her mind is pristine.  She comes accompanied by her daughter.  She is competent.  She is able to walk with a cane and a walker at times.  She is still independent and receives daily home health from nurse. Prior abdominal operations include appendectomy, cholecystectomy, hysterectomy Past Medical History:  Diagnosis Date   Anemia    Aneurysm (HCC) 1967   removed yrs ago - craniotomy   Arthritis    fingers, all over   Diabetes mellitus without complication (HCC)    Full dentures    GERD (gastroesophageal reflux disease)    Gout    Heart murmur    followed by PCP   History of hiatal hernia    repaired   Hypertension    Hypothyroidism    Legal blindness of left eye, as defined in [place]    Varicose veins of both lower extremities     Past Surgical History:  Procedure Laterality Date   ABDOMINAL HYSTERECTOMY     APPENDECTOMY     CATARACT EXTRACTION W/PHACO Left 09/17/2015   Procedure: CATARACT EXTRACTION PHACO AND INTRAOCULAR LENS PLACEMENT (IOC);  Surgeon: Dene Etienne, MD;  Location: [facility];  Service: Ophthalmology;  Laterality: Left;  DIABETIC - insulin  and oral meds   CATARACT EXTRACTION W/PHACO Right 10/22/2015   Procedure: CATARACT EXTRACTION PHACO AND INTRAOCULAR LENS PLACEMENT (IOC);  Surgeon: Dene Etienne, MD;  Location: [facility];  Service: Ophthalmology;  Laterality: Right;  DIABETIC - insulin  and oral meds   CERVICAL DISC SURGERY     CHOLECYSTECTOMY     COLONOSCOPY     COLONOSCOPY WITH PROPOFOL  N/A 12/10/2019   Procedure: COLONOSCOPY WITH BIOPSY;  Surgeon: Jinny Carmine, MD;  Location: [facility];  Service: Endoscopy;  Laterality: N/A;  diabetic   CRANIOTOMY  1967   ESOPHAGEAL DILATION  01/02/2018   Procedure: ESOPHAGEAL DILATION;  Surgeon: Jinny Carmine, MD;  Location: [facility];  Service: Endoscopy;;   ESOPHAGOGASTRODUODENOSCOPY (EGD) WITH PROPOFOL  N/A 01/02/2018   Procedure: ESOPHAGOGASTRODUODENOSCOPY (EGD) WITH PROPOFOL ;  Surgeon: Jinny Carmine, MD;  Location: [facility];  Service: Endoscopy;  Laterality: N/A;  DIABETIC   ESOPHAGOGASTRODUODENOSCOPY (EGD) WITH PROPOFOL  N/A 12/10/2019   Procedure: ESOPHAGOGASTRODUODENOSCOPY (EGD) WITH PROPOFOL ;  Surgeon: Jinny Carmine, MD;  Location: [facility];  Service: Endoscopy;  Laterality: N/A;  priority 3   hiatel hernia      nissen fundiplication   POLYPECTOMY N/A 12/10/2019   Procedure: POLYPECTOMY;  Surgeon: Jinny Carmine, MD;  Location: [facility];  Service: Endoscopy;  Laterality: N/A;   RECTAL SURGERY      Family History  Problem Relation Age of Onset   Varicose Veins Neg Hx    Vision loss Neg Hx     Social History:  reports that she has quit smoking. She has never used smokeless tobacco.  She reports that she does not drink alcohol and does not use drugs.  Allergies:  Allergies  Allergen Reactions   Neomycin Rash   Zantac [Ranitidine Hcl] Hives        Penicillin G Rash    Medications reviewed.   ROS Full ROS performed and is otherwise negative other than what is stated in the HPI    BP (!) 176/71   Pulse 97   Temp 97.9 F (36.6 C) (Oral)   Ht 5' (1.524 m)   Wt 107 lb (48.5 kg)   SpO2 100%   BMI 20.90 kg/m   Physical Exam Vitals and nursing note reviewed.  Exam conducted with a chaperone present.  Constitutional:      General: She is not in acute distress.    Appearance: Normal appearance. She is not ill-appearing.  Cardiovascular:     Rate and Rhythm: Normal rate and regular rhythm.     Heart sounds: No murmur heard. Pulmonary:     Effort: Pulmonary effort is normal. No respiratory distress.     Breath sounds: Normal breath sounds. No stridor.  Abdominal:     General: Abdomen is flat. There is no distension.     Palpations: Abdomen is soft. There is no mass.     Tenderness: There is no abdominal tenderness. There is no guarding or rebound.     Hernia: A hernia is present.     Comments: Tender left inguinal hernia, reduces  Musculoskeletal:        General: No swelling or tenderness. Normal range of motion.     Cervical back: Normal range of motion and neck supple. No rigidity or tenderness.  Skin:    General: Skin is warm and dry.     Capillary Refill: Capillary refill takes less than 2 seconds.  Neurological:     General: No focal deficit present.     Mental Status: She is alert and oriented to person, place, and time.  Psychiatric:        Mood and Affect: Mood normal.        Behavior: Behavior normal.        Thought Content: Thought content normal.        Judgment: Judgment normal.       Assessment/Plan: 89 year old female with symptomatic left inguinal hernia with crescendo symptoms and significant quality-of-life issues.  She is nonagenarian and I do think that surgery is definitely indicated.  My experience with inguinal hernias in the elderly is that complications including strangulation are devastating and doing a semielective procedure is less risky than waiting for a catastrophic emergency surgery. I do think best procedure is open inguinal hernia repair. This will provide the least hemodynamic changes and probably be the most efficient method. She is on plavix  and we will need to hold this for a week.  She discussed with  the patient and the daughter in detail.  The risk the benefit of possible implications including but not limited to: Bleeding, infection, recurrence, chronic pain I personally spent a total of 60 minutes in the care of the patient today including performing a medically appropriate exam/evaluation, counseling and educating, placing orders, referring and communicating with other health care professionals, documenting clinical information in the EHR, independently interpreting and reviewing images studies and coordinating care.      Laneta Luna, MD [facility] General Surgeon

## 2024-04-17 NOTE — Progress Notes (Signed)
 Patient has been advised of Pre-Admission date/time, and Surgery date at [facility].  Surgery Date: 04/26/24 Preadmission Testing Date: 04/20/24 (phone 8A-1P)  Patient has been made aware to call [phone], between 1-3:00pm the day before surgery, to find out what time to arrive for surgery.

## 2024-04-20 ENCOUNTER — Encounter
Admission: RE | Admit: 2024-04-20 | Discharge: 2024-04-20 | Disposition: A | Discharge status: Home / Self Care | Source: Ambulatory Visit | Attending: Surgery | Admitting: Surgery

## 2024-04-20 ENCOUNTER — Other Ambulatory Visit: Payer: Self-pay

## 2024-04-20 VITALS — Ht 60.0 in | Wt 105.0 lb

## 2024-04-20 DIAGNOSIS — I739 Peripheral vascular disease, unspecified: Secondary | ICD-10-CM

## 2024-04-20 DIAGNOSIS — Z01810 Encounter for preprocedural cardiovascular examination: Secondary | ICD-10-CM

## 2024-04-20 DIAGNOSIS — I10 Essential (primary) hypertension: Secondary | ICD-10-CM

## 2024-04-20 DIAGNOSIS — Z01812 Encounter for preprocedural laboratory examination: Secondary | ICD-10-CM

## 2024-04-20 HISTORY — DX: Esophageal obstruction: K22.2

## 2024-04-20 NOTE — Patient Instructions (Addendum)
 Your procedure is scheduled on:   THURSDAY AUGUST 28  Report to the Registration Desk on the 1st floor of the [facility] To find out your arrival time, please call [phone] between 1PM - 3PM on:  [facility]  AUGUST 27  If your arrival time is 6:00 am, do not arrive before that time as the [facility] entrance doors do not open until 6:00 am.  REMEMBER: Instructions that are not followed completely may result in serious medical risk, up to and including death; or upon the discretion of your surgeon and anesthesiologist your surgery may need to be rescheduled.  Do not eat food after midnight the night before surgery.  No gum chewing or hard candies.  You may however, drink CLEAR liquids up to 2 hours before you are scheduled to arrive for your surgery. Do not drink anything within 2 hours of your scheduled arrival time.  Clear liquids include: - water   - apple juice without pulp - gatorade (not RED colors) - black coffee or tea (Do NOT add milk or creamers to the coffee or tea) Do NOT drink anything that is not on this list.  One week prior to surgery:   Stop Anti-inflammatories (NSAIDS) such as Advil, Aleve, Ibuprofen, Motrin, Naproxen, Naprosyn and Aspirin based products such as Excedrin, Goody's Powder, BC Powder. Stop ANY OVER THE COUNTER supplements until after surgery. Ferrous Sulfate (IRON  SUPPLEMENT PO)  Vitamin D, Ergocalciferol, (DRISDOL)  You may however, continue to take Tylenol  if needed for pain up until the day of surgery.  **Follow guidelines for insulin  and diabetes medications.** metFORMIN (GLUCOPHAGE) hold 2 days before surgery, last dose MONDAY AUGUST 25   **Follow recommendations regarding stopping blood thinners.** clopidogrel  (PLAVIX ) hold 5 days prior to surgery. Last dose Friday August 22.   Continue taking all of your other prescription medications up until the day of surgery.  ON THE DAY OF SURGERY ONLY TAKE THESE MEDICATIONS WITH SIPS OF  WATER :  levothyroxine (SYNTHROID)  omeprazole (PRILOSEC)    No Alcohol for 24 hours before or after surgery.  Do not use any recreational drugs for at least a week (preferably 2 weeks) before your surgery.  Please be advised that the combination of cocaine and anesthesia may have negative outcomes, up to and including death. If you test positive for cocaine, your surgery will be cancelled.  On the morning of surgery brush your teeth with toothpaste and water , you may rinse your mouth with mouthwash if you wish. Do not swallow any toothpaste or mouthwash.  Use CHG Soap as directed on instruction sheet.  Do not wear jewelry, make-up, hairpins, clips or nail polish.  For welded (permanent) jewelry: bracelets, anklets, waist bands, etc.  Please have this removed prior to surgery.  If it is not removed, there is a chance that hospital personnel will need to cut it off on the day of surgery.  Do not wear lotions, powders, or perfumes.   Do not shave body hair from the neck down 48 hours before surgery.  Contact lenses, hearing aids and dentures may not be worn into surgery.  Do not bring valuables to the hospital. [facility] is not responsible for any missing/lost belongings or valuables.   Notify your doctor if there is any change in your medical condition (cold, fever, infection).  Wear comfortable clothing (specific to your surgery type) to the hospital.  After surgery, you can help prevent lung complications by doing breathing exercises.  Take deep breaths and cough every  1-2 hours.   When coughing or sneezing, hold a pillow firmly against your incision with both hands. This is called "splinting." Doing this helps protect your incision. It also decreases belly discomfort.  If you are being discharged the day of surgery, you will not be allowed to drive home. You will need a responsible individual to drive you home and stay with you for 24 hours after surgery.   If you are  taking public transportation, you will need to have a responsible individual with you.  Please call the Pre-admissions Testing Dept. at [phone] if you have any questions about these instructions.  Surgery Visitation Policy:  Patients having surgery or a procedure may have two visitors.  Children under the age of 60 must have an adult with them who is not the patient.   Merchandiser, retail to address health-related social needs:  [email or web address]                                                                                                                Preparing for Surgery with CHLORHEXIDINE GLUCONATE (CHG) Soap  Chlorhexidine Gluconate (CHG) Soap  o An antiseptic cleaner that kills germs and bonds with the skin to continue killing germs even after washing  o Used for showering the night before surgery and morning of surgery  Before surgery, you can play an important role by reducing the number of germs on your skin.  CHG (Chlorhexidine gluconate) soap is an antiseptic cleanser which kills germs and bonds with the skin to continue killing germs even after washing.  Please do not use if you have an allergy to CHG or antibacterial soaps. If your skin becomes reddened/irritated stop using the CHG.  1. Shower the NIGHT BEFORE SURGERY and the MORNING OF SURGERY with CHG soap.  2. If you choose to wash your hair, wash your hair first as usual with your normal shampoo.  3. After shampooing, rinse your hair and body thoroughly to remove the shampoo.  4. Use CHG as you would any other liquid soap. You can apply CHG directly to the skin and wash gently with a scrungie or a clean washcloth.  5. Apply the CHG soap to your body only from the neck down. Do not use on open wounds or open sores. Avoid contact with your eyes, ears, mouth, and genitals (private parts). Wash face and genitals (private parts) with your normal soap.  6. Wash thoroughly, paying  special attention to the area where your surgery will be performed.  7. Thoroughly rinse your body with warm water .  8. Do not shower/wash with your normal soap after using and rinsing off the CHG soap.  9. Pat yourself dry with a clean towel.  10. Wear clean pajamas to bed the night before surgery.  11. Place clean sheets on your bed the night of your first shower and do not sleep with pets.  12. Shower again with the CHG soap on the day of surgery prior to arriving at the hospital.  13.  Do not apply any deodorants/lotions/powders.  14. Please wear clean clothes to the hospital.

## 2024-04-26 ENCOUNTER — Encounter
Admission: RE | Disposition: A | Discharge status: Home / Self Care | Payer: Self-pay | Source: Home / Self Care | Attending: Surgery

## 2024-04-26 ENCOUNTER — Ambulatory Visit
Admission: RE | Admit: 2024-04-26 | Discharge: 2024-04-26 | Disposition: A | Discharge status: Home / Self Care | Attending: Surgery | Admitting: Surgery

## 2024-04-26 ENCOUNTER — Other Ambulatory Visit: Payer: Self-pay

## 2024-04-26 ENCOUNTER — Ambulatory Visit: Payer: Self-pay | Admitting: Urgent Care

## 2024-04-26 DIAGNOSIS — R011 Cardiac murmur, unspecified: Secondary | ICD-10-CM | POA: Diagnosis not present

## 2024-04-26 DIAGNOSIS — Z90710 Acquired absence of both cervix and uterus: Secondary | ICD-10-CM | POA: Diagnosis not present

## 2024-04-26 DIAGNOSIS — H548 Legal blindness, as defined in USA: Secondary | ICD-10-CM | POA: Diagnosis not present

## 2024-04-26 DIAGNOSIS — I6529 Occlusion and stenosis of unspecified carotid artery: Secondary | ICD-10-CM | POA: Diagnosis not present

## 2024-04-26 DIAGNOSIS — Z9049 Acquired absence of other specified parts of digestive tract: Secondary | ICD-10-CM | POA: Diagnosis not present

## 2024-04-26 DIAGNOSIS — Z87891 Personal history of nicotine dependence: Secondary | ICD-10-CM | POA: Insufficient documentation

## 2024-04-26 DIAGNOSIS — I10 Essential (primary) hypertension: Secondary | ICD-10-CM | POA: Diagnosis not present

## 2024-04-26 DIAGNOSIS — K4090 Unilateral inguinal hernia, without obstruction or gangrene, not specified as recurrent: Secondary | ICD-10-CM | POA: Diagnosis present

## 2024-04-26 DIAGNOSIS — Z01810 Encounter for preprocedural cardiovascular examination: Secondary | ICD-10-CM

## 2024-04-26 DIAGNOSIS — Z7902 Long term (current) use of antithrombotics/antiplatelets: Secondary | ICD-10-CM | POA: Diagnosis not present

## 2024-04-26 HISTORY — PX: INGUINAL HERNIA REPAIR: SHX194

## 2024-04-26 LAB — GLUCOSE, CAPILLARY
Glucose-Capillary: 134 mg/dL — ABNORMAL HIGH (ref 70–99)
Glucose-Capillary: 150 mg/dL — ABNORMAL HIGH (ref 70–99)

## 2024-04-26 SURGERY — REPAIR, HERNIA, INGUINAL, ADULT
Anesthesia: General | Laterality: Left

## 2024-04-26 MED ORDER — PROPOFOL 10 MG/ML IV BOLUS
INTRAVENOUS | Status: DC | PRN
  Administered 2024-04-26: 50 mg via INTRAVENOUS

## 2024-04-26 MED ORDER — CEFAZOLIN SODIUM-DEXTROSE 2-4 GM/100ML-% IV SOLN
INTRAVENOUS | Status: AC
  Filled 2024-04-26: qty 100

## 2024-04-26 MED ORDER — KETOROLAC TROMETHAMINE 15 MG/ML IJ SOLN
15.0000 mg | Freq: Once | INTRAMUSCULAR | Status: AC
  Administered 2024-04-26: 15 mg via INTRAVENOUS

## 2024-04-26 MED ORDER — BUPIVACAINE LIPOSOME 1.3 % IJ SUSP
INTRAMUSCULAR | Status: AC
  Filled 2024-04-26: qty 20

## 2024-04-26 MED ORDER — ACETAMINOPHEN 500 MG PO TABS
1000.0000 mg | ORAL_TABLET | ORAL | Status: AC
  Administered 2024-04-26: 1000 mg via ORAL

## 2024-04-26 MED ORDER — HYDROCODONE-ACETAMINOPHEN 5-325 MG PO TABS: 1.0000 | ORAL_TABLET | Freq: Four times a day (QID) | ORAL | 0 refills | Status: DC | PRN

## 2024-04-26 MED ORDER — ROCURONIUM BROMIDE 10 MG/ML (PF) SYRINGE
PREFILLED_SYRINGE | INTRAVENOUS | Status: AC
  Filled 2024-04-26: qty 10

## 2024-04-26 MED ORDER — PHENYLEPHRINE 80 MCG/ML (10ML) SYRINGE FOR IV PUSH (FOR BLOOD PRESSURE SUPPORT)
PREFILLED_SYRINGE | INTRAVENOUS | Status: DC | PRN
Start: 2024-04-26 — End: 2024-04-26
  Administered 2024-04-26: 80 ug via INTRAVENOUS

## 2024-04-26 MED ORDER — LABETALOL HCL 5 MG/ML IV SOLN
INTRAVENOUS | Status: AC
  Filled 2024-04-26: qty 4

## 2024-04-26 MED ORDER — DEXMEDETOMIDINE HCL IN NACL 80 MCG/20ML IV SOLN
INTRAVENOUS | Status: AC
  Filled 2024-04-26: qty 20

## 2024-04-26 MED ORDER — ONDANSETRON HCL 4 MG/2ML IJ SOLN
INTRAMUSCULAR | Status: AC
  Filled 2024-04-26: qty 2

## 2024-04-26 MED ORDER — MIDAZOLAM HCL 2 MG/2ML IJ SOLN
1.0000 mg | Freq: Once | INTRAMUSCULAR | Status: AC
  Administered 2024-04-26: 0.5 mg via INTRAVENOUS

## 2024-04-26 MED ORDER — GABAPENTIN 300 MG PO CAPS
300.0000 mg | ORAL_CAPSULE | ORAL | Status: AC
  Administered 2024-04-26: 300 mg via ORAL

## 2024-04-26 MED ORDER — MIDAZOLAM HCL 2 MG/2ML IJ SOLN
INTRAMUSCULAR | Status: AC
  Filled 2024-04-26: qty 2

## 2024-04-26 MED ORDER — SUGAMMADEX SODIUM 500 MG/5ML IV SOLN
INTRAVENOUS | Status: DC | PRN
Start: 2024-04-26 — End: 2024-04-26
  Administered 2024-04-26 (×2): 200 mg via INTRAVENOUS

## 2024-04-26 MED ORDER — CHLORHEXIDINE GLUCONATE CLOTH 2 % EX PADS: 6.0000 | MEDICATED_PAD | Freq: Once | CUTANEOUS | Status: DC

## 2024-04-26 MED ORDER — HYDROMORPHONE HCL 1 MG/ML IJ SOLN
INTRAMUSCULAR | Status: AC
  Filled 2024-04-26: qty 1

## 2024-04-26 MED ORDER — CEFAZOLIN SODIUM-DEXTROSE 2-4 GM/100ML-% IV SOLN
2.0000 g | INTRAVENOUS | Status: AC
  Administered 2024-04-26: 2 g via INTRAVENOUS

## 2024-04-26 MED ORDER — ORAL CARE MOUTH RINSE: 15.0000 mL | Freq: Once | OROMUCOSAL | Status: DC

## 2024-04-26 MED ORDER — LABETALOL HCL 5 MG/ML IV SOLN
5.0000 mg | INTRAVENOUS | Status: AC | PRN
  Administered 2024-04-26 (×3): 5 mg via INTRAVENOUS

## 2024-04-26 MED ORDER — BUPIVACAINE-EPINEPHRINE 0.25% -1:200000 IJ SOLN
INTRAMUSCULAR | Status: DC | PRN
  Administered 2024-04-26: 50 mL via INTRAMUSCULAR

## 2024-04-26 MED ORDER — PROPOFOL 10 MG/ML IV BOLUS
INTRAVENOUS | Status: AC
  Filled 2024-04-26: qty 20

## 2024-04-26 MED ORDER — HYDRALAZINE HCL 20 MG/ML IJ SOLN
10.0000 mg | Freq: Once | INTRAMUSCULAR | Status: AC
  Administered 2024-04-26: 10 mg via INTRAVENOUS

## 2024-04-26 MED ORDER — FENTANYL CITRATE (PF) 100 MCG/2ML IJ SOLN
INTRAMUSCULAR | Status: AC
  Filled 2024-04-26: qty 2

## 2024-04-26 MED ORDER — ROCURONIUM BROMIDE 100 MG/10ML IV SOLN
INTRAVENOUS | Status: DC | PRN
Start: 2024-04-26 — End: 2024-04-26
  Administered 2024-04-26: 40 mg via INTRAVENOUS

## 2024-04-26 MED ORDER — LACTATED RINGERS IV SOLN: INTRAVENOUS | Status: DC | PRN

## 2024-04-26 MED ORDER — NITROGLYCERIN IN D5W 200-5 MCG/ML-% IV SOLN
INTRAVENOUS | Status: AC
  Filled 2024-04-26: qty 250

## 2024-04-26 MED ORDER — ONDANSETRON HCL 4 MG/2ML IJ SOLN
INTRAMUSCULAR | Status: DC | PRN
  Administered 2024-04-26: 4 mg via INTRAVENOUS

## 2024-04-26 MED ORDER — CHLORHEXIDINE GLUCONATE 0.12 % MT SOLN: 15.0000 mL | Freq: Once | OROMUCOSAL | Status: DC

## 2024-04-26 MED ORDER — BUPIVACAINE-EPINEPHRINE (PF) 0.25% -1:200000 IJ SOLN
INTRAMUSCULAR | Status: AC
  Filled 2024-04-26: qty 30

## 2024-04-26 MED ORDER — 0.9 % SODIUM CHLORIDE (POUR BTL) OPTIME
TOPICAL | Status: DC | PRN
  Administered 2024-04-26: 500 mL

## 2024-04-26 MED ORDER — HYDROMORPHONE HCL 1 MG/ML IJ SOLN
INTRAMUSCULAR | Status: DC | PRN
  Administered 2024-04-26: .5 mg via INTRAVENOUS

## 2024-04-26 MED ORDER — GABAPENTIN 300 MG PO CAPS
ORAL_CAPSULE | ORAL | Status: AC
  Filled 2024-04-26: qty 1

## 2024-04-26 MED ORDER — CHLORHEXIDINE GLUCONATE 0.12 % MT SOLN
OROMUCOSAL | Status: AC
  Filled 2024-04-26: qty 15

## 2024-04-26 MED ORDER — KETOROLAC TROMETHAMINE 15 MG/ML IJ SOLN
INTRAMUSCULAR | Status: AC
  Filled 2024-04-26: qty 1

## 2024-04-26 MED ORDER — FENTANYL CITRATE (PF) 100 MCG/2ML IJ SOLN
INTRAMUSCULAR | Status: DC | PRN
  Administered 2024-04-26 (×3): 25 ug via INTRAVENOUS

## 2024-04-26 MED ORDER — LIDOCAINE HCL (CARDIAC) PF 100 MG/5ML IV SOSY
PREFILLED_SYRINGE | INTRAVENOUS | Status: DC | PRN
Start: 2024-04-26 — End: 2024-04-26
  Administered 2024-04-26: 60 mg via INTRAVENOUS

## 2024-04-26 MED ORDER — HYDRALAZINE HCL 20 MG/ML IJ SOLN
INTRAMUSCULAR | Status: AC
  Filled 2024-04-26: qty 1

## 2024-04-26 MED ORDER — ACETAMINOPHEN 500 MG PO TABS
ORAL_TABLET | ORAL | Status: AC
  Filled 2024-04-26: qty 2

## 2024-04-26 MED ORDER — SODIUM CHLORIDE 0.9 % IV SOLN: INTRAVENOUS | Status: DC

## 2024-04-26 MED ORDER — ONDANSETRON HCL 4 MG/2ML IJ SOLN
INTRAMUSCULAR | Status: AC
Start: 2024-04-26 — End: 2024-04-26
  Filled 2024-04-26: qty 2

## 2024-04-26 MED ORDER — DEXMEDETOMIDINE HCL IN NACL 80 MCG/20ML IV SOLN
INTRAVENOUS | Status: DC | PRN
  Administered 2024-04-26: 4 ug via INTRAVENOUS

## 2024-04-26 MED ORDER — LIDOCAINE HCL (PF) 2 % IJ SOLN
INTRAMUSCULAR | Status: AC
  Filled 2024-04-26: qty 5

## 2024-04-26 SURGICAL SUPPLY — 27 items
BLADE CLIPPER SURG (BLADE) IMPLANT
CHLORAPREP W/TINT 26 (MISCELLANEOUS) IMPLANT
DERMABOND ADVANCED .7 DNX12 (GAUZE/BANDAGES/DRESSINGS) ×1 IMPLANT
DRAIN PENROSE 0.625X18 (DRAIN) ×1 IMPLANT
DRAPE INCISE IOBAN 66X45 STRL (DRAPES) ×1 IMPLANT
DRAPE LAPAROTOMY 77X122 PED (DRAPES) ×1 IMPLANT
ELECTRODE REM PT RTRN 9FT ADLT (ELECTROSURGICAL) ×1 IMPLANT
GAUZE 4X4 16PLY ~~LOC~~+RFID DBL (SPONGE) IMPLANT
GLOVE BIO SURGEON STRL SZ7 (GLOVE) ×1 IMPLANT
GOWN STRL REUS W/ TWL LRG LVL3 (GOWN DISPOSABLE) ×2 IMPLANT
MANIFOLD NEPTUNE II (INSTRUMENTS) ×1 IMPLANT
MESH SYNTHETIC 1.8X4 KEYHOLE S (Mesh General) IMPLANT
NDL HYPO 22X1.5 SAFETY MO (MISCELLANEOUS) ×1 IMPLANT
NEEDLE HYPO 22X1.5 SAFETY MO (MISCELLANEOUS) ×1 IMPLANT
NS IRRIG 1000ML POUR BTL (IV SOLUTION) ×1 IMPLANT
PACK BASIN MINOR ARMC (MISCELLANEOUS) ×1 IMPLANT
SPONGE KITTNER 5P (MISCELLANEOUS) ×1 IMPLANT
SPONGE T-LAP 18X18 ~~LOC~~+RFID (SPONGE) ×1 IMPLANT
SUT ETHIBOND NAB MO 7 #0 18IN (SUTURE) ×1 IMPLANT
SUT MNCRL AB 4-0 PS2 18 (SUTURE) ×1 IMPLANT
SUT SILK 2 0SH CR/8 30 (SUTURE) IMPLANT
SUT SILK 2-0 18XBRD TIE 12 (SUTURE) IMPLANT
SUT VIC AB 3-0 54X BRD REEL (SUTURE) ×1 IMPLANT
SUT VIC AB 3-0 SH 27X BRD (SUTURE) ×1 IMPLANT
SYR 20ML LL LF (SYRINGE) ×1 IMPLANT
TRAP FLUID SMOKE EVACUATOR (MISCELLANEOUS) ×1 IMPLANT
WATER STERILE IRR 500ML POUR (IV SOLUTION) ×1 IMPLANT

## 2024-04-26 NOTE — Interval H&P Note (Signed)
 History and Physical Interval Note:  04/26/2024 1:33 PM  Nancy Green  has presented today for surgery, with the diagnosis of Left Inguinal Hernia.  The various methods of treatment have been discussed with the patient and family. After consideration of risks, benefits and other options for treatment, the patient has consented to  Procedure(s): REPAIR, HERNIA, INGUINAL, ADULT (Left) as a surgical intervention.  The patient's history has been reviewed, patient examined, no change in status, stable for surgery.  I have reviewed the patient's chart and labs.  Questions were answered to the patient's satisfaction.     Nayah Lukens F Aliviya Schoeller

## 2024-04-26 NOTE — Anesthesia Preprocedure Evaluation (Addendum)
 Anesthesia Evaluation  Patient identified by MRN, date of birth, ID band Patient awake    Reviewed: Allergy & Precautions, H&P , NPO status , Patient's Chart, lab work & pertinent test results  Airway Mallampati: III  TM Distance: >3 FB Neck ROM: full    Dental  (+) Edentulous Upper, Edentulous Lower   Pulmonary former smoker   Pulmonary exam normal        Cardiovascular hypertension, + Peripheral Vascular Disease (Carotid stenosis)  Normal cardiovascular exam+ Valvular Problems/Murmurs   ECHO 7/25: CONCLUSION -------------------------------------------------------------------------------  NORMAL LEFT VENTRICULAR SYSTOLIC FUNCTION WITH MODERATE LVH  ESTIMATED EF: >55%, CALC EF(2D): 60%  INDETERMINATE DIASTOLIC FUNCTION  NORMAL RIGHT VENTRICULAR SYSTOLIC FUNCTION  VALVULAR REGURGITATION: MILD AR, TRIVIAL MR, TRIVIAL PR, MODERATE TR  ESTIMATED RVSP: 47 mmHg (ABNORMAL)  VALVULAR STENOSIS: No AS, MODERATE MS, No PS, No TS     Neuro/Psych Legal blindness of left eye CVA (2012)  negative psych ROS   GI/Hepatic Neg liver ROS,GERD  ,,  Endo/Other  diabetes    Renal/GU      Musculoskeletal  (+) Arthritis ,    Abdominal Normal abdominal exam  (+)   Peds  Hematology  (+) Blood dyscrasia, anemia   Anesthesia Other Findings Past Medical History: No date: Anemia 1967: Aneurysm (HCC)     Comment:  removed yrs ago - craniotomy No date: Arthritis     Comment:  fingers, all over 08/31/2016: Carotid stenosis No date: Diabetes mellitus without complication (HCC) No date: Full dentures No date: GERD (gastroesophageal reflux disease) No date: Gout No date: Heart murmur     Comment:  followed by PCP No date: History of hiatal hernia     Comment:  repaired 06/29/2011: History of stroke 06/29/2011: Hyperlipidemia due to type 2 diabetes mellitus (HCC) No date: Hypertension No date: Hypothyroidism No date: Legal blindness  of left eye, as defined in [place] 02/14/2014: Microalbuminuria 06/29/2011: Mitral insufficiency 06/29/2011: Nodule of right lung 06/29/2011: Osteoporosis, post-menopausal 02/17/2017: PAD (peripheral artery disease) (HCC) No date: Stricture and stenosis of esophagus 04/21/2015: Temporal arteritis (HCC) 11/25/2013: Thrombocytopenia (HCC) No date: Varicose veins of both lower extremities  Past Surgical History: No date: ABDOMINAL HYSTERECTOMY No date: APPENDECTOMY 09/17/2015: CATARACT EXTRACTION W/PHACO; Left     Comment:  Procedure: CATARACT EXTRACTION PHACO AND INTRAOCULAR               LENS PLACEMENT (IOC);  Surgeon: Dene Etienne, MD;               Location: [facility] SUR[facility];  Service: Ophthalmology;                Laterality: Left;  DIABETIC - insulin  and oral meds 10/22/2015: CATARACT EXTRACTION W/PHACO; Right     Comment:  Procedure: CATARACT EXTRACTION PHACO AND INTRAOCULAR               LENS PLACEMENT (IOC);  Surgeon: Dene Etienne, MD;               Location: [facility] SUR[facility]ERY [facility];  Service: Ophthalmology;                Laterality: Right;  DIABETIC - insulin  and oral meds No date: CERVICAL DISC SURGERY No date: CHOLECYSTECTOMY No date: COLONOSCOPY 12/10/2019: COLONOSCOPY WITH PROPOFOL ; N/A     Comment:  Procedure: COLONOSCOPY WITH BIOPSY;  Surgeon: Jinny Carmine, MD;  Location: [facility] SURGERY [facility];  Service:  Endoscopy;  Laterality: N/A;  diabetic 1967: CRANIOTOMY 01/02/2018: ESOPHAGEAL DILATION     Comment:  Procedure: ESOPHAGEAL DILATION;  Surgeon: Jinny Carmine,               MD;  Location: [facility] SUR[facility];  Service: Endoscopy;; 01/02/2018: ESOPHAGOGASTRODUODENOSCOPY (EGD) WITH PROPOFOL ; N/A     Comment:  Procedure: ESOPHAGOGASTRODUODENOSCOPY (EGD) WITH               PROPOFOL ;  Surgeon: Jinny Carmine, MD;  Location: [facility]               SURGERY CNTR;  Service: Endoscopy;  Laterality: N/A;                DIABETIC 12/10/2019:  ESOPHAGOGASTRODUODENOSCOPY (EGD) WITH PROPOFOL ; N/A     Comment:  Procedure: ESOPHAGOGASTRODUODENOSCOPY (EGD) WITH               PROPOFOL ;  Surgeon: Jinny Carmine, MD;  Location: [facility]               SURGERY CNTR;  Service: Endoscopy;  Laterality: N/A;                priority 3 No date: hiatel hernia      Comment:  nissen fundiplication 12/10/2019: POLYPECTOMY; N/A     Comment:  Procedure: POLYPECTOMY;  Surgeon: Jinny Carmine, MD;                Location: [facility] SURGERY CNTR;  Service: Endoscopy;                Laterality: N/A; No date: RECTAL SURGERY     Reproductive/Obstetrics negative OB ROS                              Anesthesia Physical Anesthesia Plan  ASA: 3  Anesthesia Plan: General   Post-op Pain Management: Regional block*   Induction: Intravenous  PONV Risk Score and Plan: 2 and Ondansetron , Dexamethasone and Midazolam   Airway Management Planned: LMA  Additional Equipment:   Intra-op Plan:   Post-operative Plan: Extubation in OR  Informed Consent: I have reviewed the patients History and Physical, chart, labs and discussed the procedure including the risks, benefits and alternatives for the proposed anesthesia with the patient or authorized representative who has indicated his/her understanding and acceptance.     Dental Advisory Given  Plan Discussed with: CRNA and Surgeon  Anesthesia Plan Comments: (Hypertensive currently as she held three blood pressure medications. She is asymptomatic from an end-organ damage standpoint. She asks for anxiolytic. We will assess the effects of iv midazolam  and treat blood pressure as needed. )         Anesthesia Quick Evaluation

## 2024-04-26 NOTE — Transfer of Care (Addendum)
 Immediate Anesthesia Transfer of Care Note  Patient: Nancy Green  Procedure(s) Performed: REPAIR, HERNIA, INGUINAL, ADULT (Left)  Patient Location: PACU  Anesthesia Type:General  Level of Consciousness: drowsy and patient cooperative  Airway & Oxygen Therapy: Patient Spontanous Breathing and Patient connected to face mask oxygen  Post-op Assessment: Report given to RN and Post -op Vital signs reviewed and stable  Post vital signs: stable  Last Vitals:  Vitals Value Taken Time  BP    Temp    Pulse    Resp    SpO2      Last Pain:  Vitals:   04/26/24 1338  TempSrc: Oral         Complications: No notable events documented.

## 2024-04-26 NOTE — Anesthesia Postprocedure Evaluation (Signed)
 Anesthesia Post Note  Patient: Nancy Green  Procedure(s) Performed: REPAIR, HERNIA, INGUINAL, ADULT (Left)  Patient location during evaluation: PACU Anesthesia Type: General Level of consciousness: awake and alert Pain management: pain level controlled Vital Signs Assessment: post-procedure vital signs reviewed and stable Respiratory status: spontaneous breathing, nonlabored ventilation and respiratory function stable Cardiovascular status: blood pressure returned to baseline and stable Postop Assessment: no apparent nausea or vomiting Anesthetic complications: no   There were no known notable events for this encounter.   Last Vitals:  Vitals:   04/26/24 1726 04/26/24 1739  BP:  (!) 179/83  Pulse:    Resp: 20 18  Temp:    SpO2: 96%     Last Pain:  Vitals:   04/26/24 1719  TempSrc:   PainSc: 4                  Camellia Merilee Louder

## 2024-04-26 NOTE — Op Note (Signed)
 Open Left Inguinal Hernia Repair with Mesh      Pre-operative Diagnosis:  LeftInguinal Hernia   Post-operative Diagnosis: Same   Surgeon: Laneta Luna, MD FACS   Anesthesia: Gen. with endotracheal tube   Findings:  Left indirect inguinal hernia        Procedure Details  The patient was seen again in the Holding Room. The benefits, complications, treatment options, and expected outcomes were discussed with the patient. The risks of bleeding, infection, recurrence of symptoms, failure to resolve symptoms, recurrence of hernia, ischemic orchitis, chronic pain syndrome or neuroma, were discussed again. The likelihood of improving the patient's symptoms with return to their baseline status is good.  The patient and/or family concurred with the proposed plan, giving informed consent.  The patient was taken to Operating Room, identified  and the procedure verified as  Inguinal Hernia Repair. Laterality confirmed.  A Time Out was held and the above information confirmed.   Prior to the induction of general anesthesia, antibiotic prophylaxis was administered. VTE prophylaxis was in place. General endotracheal anesthesia was then administered and tolerated well. After the induction, the abdomen was prepped with Chloraprep and draped in the sterile fashion. The patient was positioned in the supine position.    Left inguinal incision was created with a 15 blade knife and electrocautery was used to dissect through the subcutaneous tissue.  The external oblique was dissected and incised.  Please note that the whole floor was very attenuated.  We identified an indirect inguinal hernia. We inspected and confirmed that the defect was in fact inguinal and not femoral. .  I was able to finally restore the  anatomy and identified an indirect hernia. I was Able to reduce the sac into the abdominal cavity.  Please note that the hernia was reducible. In order to reinforce the floor of the inguinal canal I was able to  approximate the conjoined tendon to the shelving edge of the inguinal ligament.I used polypropylene small keyhole Mesh and  performed Lichtenstein repair by fixing the mesh to the shelving edge of the inguinal ligament laterally and to the conjoined tendon medially.  The round ligament was divided.. I was able to close the external oblique fascia with 2-0 Vicryl in a running fashion.  Using Exparel  a field block was performed under direct visualization.  The subcutaneous tissue was approximated using 3-0 Vicryl 4-0 subcuticular Monocryl was used at all skin edges. Dermabond was placed.  Patient tolerated the procedure well. There were no complications. SHe was taken to the recovery room in stable condition.

## 2024-04-26 NOTE — Discharge Instructions (Addendum)
 Groin Hernia (Inguinal Hernia) in Adults: What to Know  A hernia happens when an organ or tissue in your body pushes out through a weak spot in the muscles of your belly. This makes a bulge. A groin hernia is also called an inguinal hernia. It's found in your groin, which is the area where your leg meets your lower belly. This kind of hernia could also be: In your scrotum, if you're female. In the folds of skin around your vagina, if you're female. You may be able to push the bulge back into your belly. If you can't push it in and blood flow is cut off to the hernia, you'll need surgery right away. What are the causes? A groin hernia may happen when you strain your belly muscles, such as when you: Lift a heavy object. Strain to poop. Cough. What increases the risk? You may be more likely to get a groin hernia if: You're female. You're 50 years or older. You're pregnant. You've had a groin hernia or belly surgery before. You smoke. You're overweight. You work at a job where you need to stand a lot or lift heavy things. What are the signs or symptoms? Symptoms may depend on how big the hernia is. If it's small, you may not have symptoms. If it's bigger, you may have: A bulge near your groin or genitals. Pain or burning in your groin. A dull ache or feeling of pressure in your groin. If blood flow is cut off to the tissues inside the hernia, you may also: Feel pain and tenderness when you touch the bulge. The skin over it may turn red or purple. Have a fever. Throw up or feel like you may throw up. Have trouble pooping or passing gas. How is this treated? Treatment depends on how big the hernia is and what symptoms you have. You may need: To be watched to see if the bulge grows bigger. Surgery. This may be done if the hernia is big or if you have symptoms. Follow these instructions at home: Lifestyle Ask if it's OK for you to lift. Try not to stand for long periods of time. Do not  smoke, vape, or use nicotine or tobacco. Stay at a healthy weight. Try not to do things that put pressure on your hernia. Preventing trouble pooping You may need to take these steps to help prevent or treat trouble pooping (constipation): Take medicines to help you poop. Eat foods high in fiber, like beans, whole grains, and fresh fruits and vegetables. Drink more fluids as told. General instructions Try to push the hernia back in place by very gently pressing on it while lying down. Do not try to force it back in if it won't push in easily. Watch your hernia for any changes in: Shape. Size. Color. Take your medicines only as told. Contact a doctor if: You have a fever. You have new symptoms. Your symptoms get worse. You can't poop or pass gas. Get help right away if: Your bulge: Starts to hurt a lot. Changes color. You have sudden pain in your scrotum, or your scrotum changes size. You can't gently push the hernia back in place. You feel like you may vomit, and that feeling does not go away. You keep throwing up or feeling like you need to throw up. These symptoms may be an emergency. Call 911 right away. Do not wait to see if the symptoms will go away. Do not drive yourself to the hospital. This information is  not intended to replace advice given to you by your health care provider. Make sure you discuss any questions you have with your health care provider. Document Revised: 04/14/2023 Document Reviewed: 04/14/2023 Elsevier Patient Education  2024 [employer].   Resume Eliquis tomorrow 04/27/2024

## 2024-04-26 NOTE — Anesthesia Procedure Notes (Signed)
 Procedure Name: Intubation Date/Time: 04/26/2024 2:48 PM  Performed by: Vicci Camellia Glatter, MDPre-anesthesia Checklist: Patient identified, Emergency Drugs available, Suction available and Patient being monitored Patient Re-evaluated:Patient Re-evaluated prior to induction Oxygen Delivery Method: Circle system utilized Preoxygenation: Pre-oxygenation with 100% oxygen Induction Type: IV induction Ventilation: Mask ventilation without difficulty Laryngoscope Size: McGrath and 3 Grade View: Grade I Tube type: Oral Tube size: 7.0 mm Number of attempts: 1 Airway Equipment and Method: Stylet Placement Confirmation: ETT inserted through vocal cords under direct vision, positive ETCO2 and breath sounds checked- equal and bilateral Secured at: 22 cm Tube secured with: Tape Dental Injury: Teeth and Oropharynx as per pre-operative assessment

## 2024-04-27 ENCOUNTER — Encounter: Payer: Self-pay | Admitting: Surgery

## 2024-05-01 ENCOUNTER — Telehealth: Payer: Self-pay | Admitting: Surgery

## 2024-05-01 NOTE — Telephone Encounter (Signed)
 Called patient back, she stated that there is some tape doing down her lefg the tape is not over the incision. She stated that its pulling her skin and causing some irration. Her daughter took a picture of it as well. Advised patient that she can take the tape off and if she wants to upload the picture on mychart she can.

## 2024-05-01 NOTE — Telephone Encounter (Signed)
 Pt said 04-26-2024 hernia surgery. She has tape going down her leg in the groin area. What's to know if she can take it off or has to leave it on. Pt # [phone]

## 2024-05-16 ENCOUNTER — Encounter: Payer: Self-pay | Admitting: Surgery

## 2024-05-16 ENCOUNTER — Ambulatory Visit (INDEPENDENT_AMBULATORY_CARE_PROVIDER_SITE_OTHER): Admitting: Surgery

## 2024-05-16 ENCOUNTER — Ambulatory Visit
Admission: RE | Admit: 2024-05-16 | Discharge: 2024-05-16 | Disposition: A | Discharge status: Home / Self Care | Source: Ambulatory Visit | Attending: Surgery | Admitting: Surgery

## 2024-05-16 ENCOUNTER — Ambulatory Visit
Admission: RE | Admit: 2024-05-16 | Discharge: 2024-05-16 | Disposition: A | Discharge status: Home / Self Care | Attending: Surgery | Admitting: Surgery

## 2024-05-16 VITALS — BP 159/69 | HR 101 | Temp 97.7°F | Ht 60.0 in | Wt 107.0 lb

## 2024-05-16 DIAGNOSIS — K5900 Constipation, unspecified: Secondary | ICD-10-CM

## 2024-05-16 DIAGNOSIS — K9189 Other postprocedural complications and disorders of digestive system: Secondary | ICD-10-CM

## 2024-05-16 DIAGNOSIS — K567 Ileus, unspecified: Secondary | ICD-10-CM | POA: Diagnosis present

## 2024-05-16 DIAGNOSIS — K4090 Unilateral inguinal hernia, without obstruction or gangrene, not specified as recurrent: Secondary | ICD-10-CM

## 2024-05-16 DIAGNOSIS — Z09 Encounter for follow-up examination after completed treatment for conditions other than malignant neoplasm: Secondary | ICD-10-CM

## 2024-05-16 NOTE — Patient Instructions (Signed)

## 2024-05-17 NOTE — Progress Notes (Signed)
 Outpatient Surgical Follow Up    Nancy Green is an 89 y.o. female.   Chief Complaint  Patient presents with   Routine Post Op    Left inguinal hernia repair 04/26/24    HPI: Patient doing well.  She does report some intermittent abdominal pain and constipation.  No fevers no chills tolerating diet well.  Past Medical History:  Diagnosis Date   Anemia    Aneurysm (HCC) 1967   removed yrs ago - craniotomy   Arthritis    fingers, all over   Carotid stenosis 08/31/2016   Diabetes mellitus without complication (HCC)    Full dentures    GERD (gastroesophageal reflux disease)    Gout    Heart murmur    followed by PCP   History of hiatal hernia    repaired   History of stroke 06/29/2011   Hyperlipidemia due to type 2 diabetes mellitus (HCC) 06/29/2011   Hypertension    Hypothyroidism    Legal blindness of left eye, as defined in [place]    Microalbuminuria 02/14/2014   Mitral insufficiency 06/29/2011   Nodule of right lung 06/29/2011   Osteoporosis, post-menopausal 06/29/2011   PAD (peripheral artery disease) (HCC) 02/17/2017   Stricture and stenosis of esophagus    Temporal arteritis (HCC) 04/21/2015   Thrombocytopenia (HCC) 11/25/2013   Varicose veins of both lower extremities     Past Surgical History:  Procedure Laterality Date   ABDOMINAL HYSTERECTOMY     APPENDECTOMY     CATARACT EXTRACTION W/PHACO Left 09/17/2015   Procedure: CATARACT EXTRACTION PHACO AND INTRAOCULAR LENS PLACEMENT (IOC);  Surgeon: Dene Etienne, MD;  Location: [facility] SUR[facility]TR;  Service: Ophthalmology;  Laterality: Left;  DIABETIC - insulin  and oral meds   CATARACT EXTRACTION W/PHACO Right 10/22/2015   Procedure: CATARACT EXTRACTION PHACO AND INTRAOCULAR LENS PLACEMENT (IOC);  Surgeon: Dene Etienne, MD;  Location: [facility];  Service: Ophthalmology;  Laterality: Right;  DIABETIC - insulin  and oral meds   CERVICAL DISC SURGERY     CHOLECYSTECTOMY      COLONOSCOPY     COLONOSCOPY WITH PROPOFOL  N/A 12/10/2019   Procedure: COLONOSCOPY WITH BIOPSY;  Surgeon: Jinny Carmine, MD;  Location: [facility] SUR[facility]ERY [facility];  Service: Endoscopy;  Laterality: N/A;  diabetic   CRANIOTOMY  1967   ESOPHAGEAL DILATION  01/02/2018   Procedure: ESOPHAGEAL DILATION;  Surgeon: Jinny Carmine, MD;  Location: [facility] SUR[facility]ERY [facility];  Service: Endoscopy;;   ESOPHAGOGASTRODUODENOSCOPY (EGD) WITH PROPOFOL  N/A 01/02/2018   Procedure: ESOPHAGOGASTRODUODENOSCOPY (EGD) WITH PROPOFOL ;  Surgeon: Jinny Carmine, MD;  Location: [facility] SURGERY [facility];  Service: Endoscopy;  Laterality: N/A;  DIABETIC   ESOPHAGOGASTRODUODENOSCOPY (EGD) WITH PROPOFOL  N/A 12/10/2019   Procedure: ESOPHAGOGASTRODUODENOSCOPY (EGD) WITH PROPOFOL ;  Surgeon: Jinny Carmine, MD;  Location: [facility] SURGERY [facility];  Service: Endoscopy;  Laterality: N/A;  priority 3   hiatel hernia      nissen fundiplication   INGUINAL HERNIA REPAIR Left 04/26/2024   Procedure: REPAIR, HERNIA, INGUINAL, ADULT;  Surgeon: Jordis Laneta FALCON, MD;  Location: [facility] ORS;  Service: General;  Laterality: Left;   POLYPECTOMY N/A 12/10/2019   Procedure: POLYPECTOMY;  Surgeon: Jinny Carmine, MD;  Location: [facility] SURGERY [facility];  Service: Endoscopy;  Laterality: N/A;   RECTAL SURGERY      Family History  Problem Relation Age of Onset   Varicose Veins Neg Hx    Vision loss Neg Hx     Social History:  reports that she has quit smoking. She has never used smokeless tobacco. She  reports that she does not drink alcohol and does not use drugs.  Allergies:  Allergies  Allergen Reactions   Neomycin Rash   Zantac [Ranitidine Hcl] Hives        Penicillin G Rash    Medications reviewed.    ROS Full ROS performed and is otherwise negative other than what is stated in HPI   BP (!) 159/69   Pulse (!) 101   Temp 97.7 F (36.5 C) (Oral)   Ht 5' (1.524 m)   Wt 107 lb (48.5 kg)   SpO2 97%   BMI 20.90 kg/m   Physical Exam  She is alert in no acute  distress. Abdomen: Soft incision healing well without evidence of infection no peritonitis appropriate incisional tenderness without rebound  Assessment/Plan: After inguinal hernia repair, he does her certainly have constipation we will obtain a KUB to confirm clinical suspicions.  Discussed with her about MiraLAX .  I can see her next week orso, no need for urgent intervention or hospitalization at this time Laneta Luna, MD [facility] General Surgeon

## 2024-05-30 ENCOUNTER — Encounter: Payer: Self-pay | Admitting: Surgery

## 2024-05-30 ENCOUNTER — Ambulatory Visit: Admitting: Surgery

## 2024-05-30 VITALS — BP 178/76 | HR 96 | Temp 97.6°F | Ht 60.0 in | Wt 108.0 lb

## 2024-05-30 DIAGNOSIS — T8131XA Disruption of external operation (surgical) wound, not elsewhere classified, initial encounter: Secondary | ICD-10-CM

## 2024-05-30 DIAGNOSIS — Z09 Encounter for follow-up examination after completed treatment for conditions other than malignant neoplasm: Secondary | ICD-10-CM

## 2024-05-30 DIAGNOSIS — K4090 Unilateral inguinal hernia, without obstruction or gangrene, not specified as recurrent: Secondary | ICD-10-CM

## 2024-05-30 DIAGNOSIS — K5900 Constipation, unspecified: Secondary | ICD-10-CM

## 2024-05-30 MED ORDER — AMOXICILLIN-POT CLAVULANATE 875-125 MG PO TABS: 1.0000 | ORAL_TABLET | Freq: Two times a day (BID) | ORAL | 0 refills | Status: AC

## 2024-05-30 MED ORDER — POLYETHYLENE GLYCOL 3350 17 GM/SCOOP PO POWD: 238.0000 g | Freq: Once | ORAL | 1 refills | Status: AC

## 2024-05-30 NOTE — Progress Notes (Signed)
 Outpatient Surgical Follow Up  05/30/2024  Kassadie Pancake is an 89 y.o. female.   Chief Complaint  Patient presents with   Routine Post Op    HPI: 5 weeks out from open [facility] repair, having some drainage, no fevers or chills, some constipation , kub per reviwed w/o free air, contipation  Past Medical History:  Diagnosis Date   Anemia    Aneurysm 1967   removed yrs ago - craniotomy   Arthritis    fingers, all over   Carotid stenosis 08/31/2016   Diabetes mellitus without complication (HCC)    Full dentures    GERD (gastroesophageal reflux disease)    Gout    Heart murmur    followed by PCP   History of hiatal hernia    repaired   History of stroke 06/29/2011   Hyperlipidemia due to type 2 diabetes mellitus (HCC) 06/29/2011   Hypertension    Hypothyroidism    Legal blindness of left eye, as defined in [place]    Microalbuminuria 02/14/2014   Mitral insufficiency 06/29/2011   Nodule of right lung 06/29/2011   Osteoporosis, post-menopausal 06/29/2011   PAD (peripheral artery disease) 02/17/2017   Stricture and stenosis of esophagus    Temporal arteritis (HCC) 04/21/2015   Thrombocytopenia 11/25/2013   Varicose veins of both lower extremities     Past Surgical History:  Procedure Laterality Date   ABDOMINAL HYSTERECTOMY     APPENDECTOMY     CATARACT EXTRACTION W/PHACO Left 09/17/2015   Procedure: CATARACT EXTRACTION PHACO AND INTRAOCULAR LENS PLACEMENT (IOC);  Surgeon: Dene Etienne, MD;  Location: [facility];  Service: Ophthalmology;  Laterality: Left;  DIABETIC - insulin  and oral meds   CATARACT EXTRACTION W/PHACO Right 10/22/2015   Procedure: CATARACT EXTRACTION PHACO AND INTRAOCULAR LENS PLACEMENT (IOC);  Surgeon: Dene Etienne, MD;  Location: [facility] SUR[facility]ERY [facility]TR;  Service: Ophthalmology;  Laterality: Right;  DIABETIC - insulin  and oral meds   CERVICAL DISC SURGERY     CHOLECYSTECTOMY     COLONOSCOPY     COLONOSCOPY WITH PROPOFOL  N/A  12/10/2019   Procedure: COLONOSCOPY WITH BIOPSY;  Surgeon: Jinny Carmine, MD;  Location: [facility];  Service: Endoscopy;  Laterality: N/A;  diabetic   CRANIOTOMY  1967   ESOPHAGEAL DILATION  01/02/2018   Procedure: ESOPHAGEAL DILATION;  Surgeon: Jinny Carmine, MD;  Location: [facility];  Service: Endoscopy;;   ESOPHAGOGASTRODUODENOSCOPY (EGD) WITH PROPOFOL  N/A 01/02/2018   Procedure: ESOPHAGOGASTRODUODENOSCOPY (EGD) WITH PROPOFOL ;  Surgeon: Jinny Carmine, MD;  Location: [facility] SUR[facility]ERY [facility];  Service: Endoscopy;  Laterality: N/A;  DIABETIC   ESOPHAGOGASTRODUODENOSCOPY (EGD) WITH PROPOFOL  N/A 12/10/2019   Procedure: ESOPHAGOGASTRODUODENOSCOPY (EGD) WITH PROPOFOL ;  Surgeon: Jinny Carmine, MD;  Location: [facility] SUR[facility];  Service: Endoscopy;  Laterality: N/A;  priority 3   hiatel hernia      nissen fundiplication   INGUINAL HERNIA REPAIR Left 04/26/2024   Procedure: REPAIR, HERNIA, INGUINAL, ADULT;  Surgeon: Jordis Laneta FALCON, MD;  Location: [facility] ORS;  Service: General;  Laterality: Left;   POLYPECTOMY N/A 12/10/2019   Procedure: POLYPECTOMY;  Surgeon: Jinny Carmine, MD;  Location: [facility];  Service: Endoscopy;  Laterality: N/A;   RECTAL SURGERY      Family History  Problem Relation Age of Onset   Varicose Veins Neg Hx    Vision loss Neg Hx     Social History:  reports that she has quit smoking. She has never used smokeless tobacco. She reports that she does not drink alcohol and  does not use drugs.  Allergies:  Allergies  Allergen Reactions   Neomycin Rash   Zantac [Ranitidine Hcl] Hives        Penicillin G Rash    Medications reviewed.    ROS Full ROS performed and is otherwise negative other than what is stated in HPI   BP (!) 178/76   Pulse 96   Temp 97.6 F (36.4 C) (Oral)   Ht 5' (1.524 m)   Wt 108 lb (49 kg)   SpO2 98%   BMI 21.09 kg/m   Physical Exam  Chronically ill and weak Abd: soft, nt Left inguinal area healing ok w superficial  opening, no infection and no abscess Rectal: empty rectal vault, no masses  Assessment/Plan: Constipation and some superficial dehiscence, we will add short course a/bs to prevent infection Miralax RTC 2 weeks  Laneta Luna, MD [facility] General Surgeon

## 2024-05-30 NOTE — Patient Instructions (Signed)

## 2024-06-18 ENCOUNTER — Encounter: Payer: Self-pay | Admitting: Surgery

## 2024-06-18 ENCOUNTER — Ambulatory Visit: Admitting: Surgery

## 2024-06-18 VITALS — BP 187/74 | HR 93 | Temp 97.7°F | Ht 60.0 in | Wt 108.4 lb

## 2024-06-18 DIAGNOSIS — K4090 Unilateral inguinal hernia, without obstruction or gangrene, not specified as recurrent: Secondary | ICD-10-CM

## 2024-06-18 DIAGNOSIS — Z09 Encounter for follow-up examination after completed treatment for conditions other than malignant neoplasm: Secondary | ICD-10-CM

## 2024-06-18 NOTE — Progress Notes (Signed)
 Outpatient Surgical Follow Up  06/18/2024  Nancy Green is an 89 y.o. female.   Chief Complaint  Patient presents with   Routine Post Op    LIH repair 04/26/24    HPI:  8 weeks out from open [facility] repair, having some drainage, no fevers or chills, some constipation , kub per reviwed w/o free air, constipation. She feels better now having BM, tolerating siet, doing everyo other day dressings  Past Medical History:  Diagnosis Date   Anemia    Aneurysm 1967   removed yrs ago - craniotomy   Arthritis    fingers, all over   Carotid stenosis 08/31/2016   Diabetes mellitus without complication (HCC)    Full dentures    GERD (gastroesophageal reflux disease)    Gout    Heart murmur    followed by PCP   History of hiatal hernia    repaired   History of stroke 06/29/2011   Hyperlipidemia due to type 2 diabetes mellitus (HCC) 06/29/2011   Hypertension    Hypothyroidism    Legal blindness of left eye, as defined in [place]    Microalbuminuria 02/14/2014   Mitral insufficiency 06/29/2011   Nodule of right lung 06/29/2011   Osteoporosis, post-menopausal 06/29/2011   PAD (peripheral artery disease) 02/17/2017   Stricture and stenosis of esophagus    Temporal arteritis (HCC) 04/21/2015   Thrombocytopenia 11/25/2013   Varicose veins of both lower extremities     Past Surgical History:  Procedure Laterality Date   ABDOMINAL HYSTERECTOMY     APPENDECTOMY     CATARACT EXTRACTION W/PHACO Left 09/17/2015   Procedure: CATARACT EXTRACTION PHACO AND INTRAOCULAR LENS PLACEMENT (IOC);  Surgeon: Dene Etienne, MD;  Location: [facility];  Service: Ophthalmology;  Laterality: Left;  DIABETIC - insulin  and oral meds   CATARACT EXTRACTION W/PHACO Right 10/22/2015   Procedure: CATARACT EXTRACTION PHACO AND INTRAOCULAR LENS PLACEMENT (IOC);  Surgeon: Dene Etienne, MD;  Location: [facility];  Service: Ophthalmology;  Laterality: Right;  DIABETIC - insulin  and  oral meds   CERVICAL DISC SURGERY     CHOLECYSTECTOMY     COLONOSCOPY     COLONOSCOPY WITH PROPOFOL  N/A 12/10/2019   Procedure: COLONOSCOPY WITH BIOPSY;  Surgeon: Jinny Carmine, MD;  Location: [facility];  Service: Endoscopy;  Laterality: N/A;  diabetic   CRANIOTOMY  1967   ESOPHAGEAL DILATION  01/02/2018   Procedure: ESOPHAGEAL DILATION;  Surgeon: Jinny Carmine, MD;  Location: [facility];  Service: Endoscopy;;   ESOPHAGOGASTRODUODENOSCOPY (EGD) WITH PROPOFOL  N/A 01/02/2018   Procedure: ESOPHAGOGASTRODUODENOSCOPY (EGD) WITH PROPOFOL ;  Surgeon: Jinny Carmine, MD;  Location: [facility];  Service: Endoscopy;  Laterality: N/A;  DIABETIC   ESOPHAGOGASTRODUODENOSCOPY (EGD) WITH PROPOFOL  N/A 12/10/2019   Procedure: ESOPHAGOGASTRODUODENOSCOPY (EGD) WITH PROPOFOL ;  Surgeon: Jinny Carmine, MD;  Location: [facility];  Service: Endoscopy;  Laterality: N/A;  priority 3   hiatel hernia      nissen fundiplication   INGUINAL HERNIA REPAIR Left 04/26/2024   Procedure: REPAIR, HERNIA, INGUINAL, ADULT;  Surgeon: Jordis Laneta FALCON, MD;  Location: [facility];  Service: General;  Laterality: Left;   POLYPECTOMY N/A 12/10/2019   Procedure: POLYPECTOMY;  Surgeon: Jinny Carmine, MD;  Location: [facility];  Service: Endoscopy;  Laterality: N/A;   RECTAL SURGERY      Family History  Problem Relation Age of Onset   Varicose Veins Neg Hx    Vision loss Neg Hx     Social History:  reports  that she has quit smoking. She has never used smokeless tobacco. She reports that she does not drink alcohol and does not use drugs.  Allergies:  Allergies  Allergen Reactions   Neomycin Rash   Zantac [Ranitidine Hcl] Hives        Penicillin G Rash    Medications reviewed.    ROS Full ROS performed and is otherwise negative other than what is stated in HPI   BP (!) 187/74   Pulse 93   Temp 97.7 F (36.5 C) (Oral)   Ht 5' (1.524 m)   Wt 108 lb 6.4 oz (49.2 kg)   SpO2 98%   BMI 21.17  kg/m   Physical Exam  Chronically ill and weak Abd: soft, nt Left inguinal area healing well , some moisture in incision. no infection and no abscess    Assessment/Plan: Doing well w/o complications, some moist in incision not uncommon on inguinal region, continue dresing changes, no infection or complications. RC 2 months  I personally spent a total of 20 minutes in the care of the patient today including performing a medically appropriate exam/evaluation, counseling and educating, placing orders, referring and communicating with other health care professionals, documenting clinical information in the EHR, independently interpreting and reviewing images studies and coordinating care.   Laneta Luna, MD [facility] General Surgeon

## 2024-06-18 NOTE — Patient Instructions (Signed)

## 2024-06-27 ENCOUNTER — Other Ambulatory Visit: Payer: Self-pay

## 2024-06-27 ENCOUNTER — Telehealth: Payer: Self-pay | Admitting: Surgery

## 2024-06-27 MED ORDER — NYSTATIN 100000 UNIT/GM EX POWD: 1.0000 | Freq: Three times a day (TID) | CUTANEOUS | 0 refills | Status: AC

## 2024-06-27 NOTE — Telephone Encounter (Signed)
 Incoming call from [place], RN with [facility] who is at the home of patient now. Patient had inguinal hernia repair done on 04/26/24 Dr. Jordis.  The nurse states that at the peri wound patient is having some redness and looks yeasty.  Not sure if needs antibiotics?  Please call her on mobile number at [phone].

## 2024-08-15 ENCOUNTER — Encounter: Payer: Self-pay | Admitting: Surgery

## 2024-08-15 ENCOUNTER — Ambulatory Visit: Admitting: Surgery

## 2024-08-15 VITALS — BP 185/68 | HR 97 | Temp 97.8°F | Ht 60.0 in | Wt 111.0 lb

## 2024-08-15 DIAGNOSIS — K4090 Unilateral inguinal hernia, without obstruction or gangrene, not specified as recurrent: Secondary | ICD-10-CM

## 2024-08-15 DIAGNOSIS — Z09 Encounter for follow-up examination after completed treatment for conditions other than malignant neoplasm: Secondary | ICD-10-CM

## 2024-08-16 NOTE — Progress Notes (Signed)
 Outpatient Surgical Follow Up    Nancy Green is an 89 y.o. female.   Chief Complaint  Patient presents with   Follow-up    LIH repair 04/26/2024    HPI:  86 1/2 monthsout from open [facility] repair, doing very well from surgical perspective . No draiange, no open wounds.  She feels better now having BM, tolerating diet.  Past Medical History:  Diagnosis Date   Anemia    Aneurysm 1967   removed yrs ago - craniotomy   Arthritis    fingers, all over   Carotid stenosis 08/31/2016   Diabetes mellitus without complication (HCC)    Full dentures    GERD (gastroesophageal reflux disease)    Gout    Heart murmur    followed by PCP   History of hiatal hernia    repaired   History of stroke 06/29/2011   Hyperlipidemia due to type 2 diabetes mellitus (HCC) 06/29/2011   Hypertension    Hypothyroidism    Legal blindness of left eye, as defined in [place]    Microalbuminuria 02/14/2014   Mitral insufficiency 06/29/2011   Nodule of right lung 06/29/2011   Osteoporosis, post-menopausal 06/29/2011   PAD (peripheral artery disease) 02/17/2017   Stricture and stenosis of esophagus    Temporal arteritis (HCC) 04/21/2015   Thrombocytopenia 11/25/2013   Varicose veins of both lower extremities     Past Surgical History:  Procedure Laterality Date   ABDOMINAL HYSTERECTOMY     APPENDECTOMY     CATARACT EXTRACTION W/PHACO Left 09/17/2015   Procedure: CATARACT EXTRACTION PHACO AND INTRAOCULAR LENS PLACEMENT (IOC);  Surgeon: Dene Etienne, MD;  Location: [facility];  Service: Ophthalmology;  Laterality: Left;  DIABETIC - insulin  and oral meds   CATARACT EXTRACTION W/PHACO Right 10/22/2015   Procedure: CATARACT EXTRACTION PHACO AND INTRAOCULAR LENS PLACEMENT (IOC);  Surgeon: Dene Etienne, MD;  Location: [facility];  Service: Ophthalmology;  Laterality: Right;  DIABETIC - insulin  and oral meds   CERVICAL DISC SURGERY     CHOLECYSTECTOMY     COLONOSCOPY      COLONOSCOPY WITH PROPOFOL  N/A 12/10/2019   Procedure: COLONOSCOPY WITH BIOPSY;  Surgeon: Jinny Carmine, MD;  Location: [facility];  Service: Endoscopy;  Laterality: N/A;  diabetic   CRANIOTOMY  1967   ESOPHAGEAL DILATION  01/02/2018   Procedure: ESOPHAGEAL DILATION;  Surgeon: Jinny Carmine, MD;  Location: [facility];  Service: Endoscopy;;   ESOPHAGOGASTRODUODENOSCOPY (EGD) WITH PROPOFOL  N/A 01/02/2018   Procedure: ESOPHAGOGASTRODUODENOSCOPY (EGD) WITH PROPOFOL ;  Surgeon: Jinny Carmine, MD;  Location: [facility];  Service: Endoscopy;  Laterality: N/A;  DIABETIC   ESOPHAGOGASTRODUODENOSCOPY (EGD) WITH PROPOFOL  N/A 12/10/2019   Procedure: ESOPHAGOGASTRODUODENOSCOPY (EGD) WITH PROPOFOL ;  Surgeon: Jinny Carmine, MD;  Location: [facility];  Service: Endoscopy;  Laterality: N/A;  priority 3   hiatel hernia      nissen fundiplication   INGUINAL HERNIA REPAIR Left 04/26/2024   Procedure: REPAIR, HERNIA, INGUINAL, ADULT;  Surgeon: Jordis Laneta FALCON, MD;  Location: [facility];  Service: General;  Laterality: Left;   POLYPECTOMY N/A 12/10/2019   Procedure: POLYPECTOMY;  Surgeon: Jinny Carmine, MD;  Location: [facility];  Service: Endoscopy;  Laterality: N/A;   RECTAL SURGERY      Family History  Problem Relation Age of Onset   Varicose Veins Neg Hx    Vision loss Neg Hx     Social History:  reports that she has quit smoking. She has never used smokeless tobacco.  She reports that she does not drink alcohol and does not use drugs.  Allergies: Allergies[1]  Medications reviewed.    ROS Full ROS performed and is otherwise negative other than what is stated in HPI   BP (!) 185/68   Pulse 97   Temp 97.8 F (36.6 C) (Oral)   Ht 5' (1.524 m)   Wt 111 lb (50.3 kg)   SpO2 97%   BMI 21.68 kg/m   Physical Exam Chronically ill and weak but NAD Abd: soft, nt Left inguinal area healed. no infection and no recurrence Neuro: GCS 15, no motor or sens deficits    Assessment/Plan:  Doing well w/o evidence of complications RTC prn  I personally spent a total of 20 minutes in the care of the patient today including performing a medically appropriate exam/evaluation, counseling and educating, placing orders, referring and communicating with other health care professionals, documenting clinical information in the EHR, independently interpreting and reviewing images studies and coordinating care.   Laneta Luna, MD FACS General Surgeon    [1]  Allergies Allergen Reactions   Neomycin Rash   Zantac [Ranitidine Hcl] Hives        Penicillin G Rash
